# Patient Record
Sex: Female | Born: 1990 | Race: Black or African American | Hispanic: No | Marital: Married | Smoking: Never smoker
Health system: Southern US, Community
[De-identification: ages and names within clinical notes are randomized; demographics above are authoritative.]

## PROBLEM LIST (undated history)

## (undated) DIAGNOSIS — E559 Vitamin D deficiency, unspecified: Secondary | ICD-10-CM

## (undated) DIAGNOSIS — L309 Dermatitis, unspecified: Secondary | ICD-10-CM

## (undated) DIAGNOSIS — F419 Anxiety disorder, unspecified: Secondary | ICD-10-CM

## (undated) DIAGNOSIS — Z91018 Allergy to other foods: Secondary | ICD-10-CM

## (undated) DIAGNOSIS — T7840XA Allergy, unspecified, initial encounter: Secondary | ICD-10-CM

## (undated) DIAGNOSIS — F32A Depression, unspecified: Secondary | ICD-10-CM

## (undated) HISTORY — DX: Dermatitis, unspecified: L30.9

## (undated) HISTORY — DX: Anxiety disorder, unspecified: F41.9

## (undated) HISTORY — DX: Allergy to other foods: Z91.018

## (undated) HISTORY — DX: Depression, unspecified: F32.A

## (undated) HISTORY — DX: Allergy, unspecified, initial encounter: T78.40XA

## (undated) HISTORY — PX: TONSILLECTOMY AND ADENOIDECTOMY: SUR1326

## (undated) HISTORY — DX: Vitamin D deficiency, unspecified: E55.9

---

## 2018-07-01 ENCOUNTER — Other Ambulatory Visit: Payer: Self-pay | Admitting: Ophthalmology

## 2018-07-01 DIAGNOSIS — H4710 Unspecified papilledema: Secondary | ICD-10-CM

## 2018-07-14 ENCOUNTER — Ambulatory Visit
Admission: RE | Admit: 2018-07-14 | Discharge: 2018-07-14 | Disposition: A | Discharge status: Home / Self Care | Payer: 59 | Source: Ambulatory Visit | Attending: Ophthalmology | Admitting: Ophthalmology

## 2018-07-14 DIAGNOSIS — H4710 Unspecified papilledema: Secondary | ICD-10-CM

## 2018-07-14 MED ORDER — GADOBENATE DIMEGLUMINE 529 MG/ML IV SOLN: 19.0000 mL | Freq: Once | INTRAVENOUS | Status: DC | PRN

## 2018-07-14 MED ORDER — GADOBENATE DIMEGLUMINE 529 MG/ML IV SOLN
19.0000 mL | Freq: Once | INTRAVENOUS | Status: AC | PRN
  Administered 2018-07-14: 19 mL via INTRAVENOUS

## 2018-07-21 ENCOUNTER — Ambulatory Visit (INDEPENDENT_AMBULATORY_CARE_PROVIDER_SITE_OTHER): Payer: 59 | Admitting: Neurology

## 2018-07-21 ENCOUNTER — Encounter: Payer: Self-pay | Admitting: Neurology

## 2018-07-21 VITALS — BP 111/68 | HR 80 | Ht 64.75 in | Wt 211.0 lb

## 2018-07-21 DIAGNOSIS — E669 Obesity, unspecified: Secondary | ICD-10-CM

## 2018-07-21 DIAGNOSIS — H4711 Papilledema associated with increased intracranial pressure: Secondary | ICD-10-CM

## 2018-07-21 DIAGNOSIS — G932 Benign intracranial hypertension: Secondary | ICD-10-CM | POA: Diagnosis not present

## 2018-07-21 NOTE — Patient Instructions (Signed)
You may have a condition called pseudotumor cerebri, which means that there increased fluid pressure around your brain and results in pressure on your eye nerve(s). 1. You have had appropriate scans.  2. You will need ongoing formal eye exams, please follow up with Dr. Sherryll Burger for recheck and full visual field test.  3. We will request a LP with pressure testing and routine fluid testing. We will call you with the results.  4. We may start a medication called diamox to help keep your spinal fluid pressure at bay.  5. As you know, your vision and visual field can be affected. The most serious complication of having pseudotumor cerebri is loss of vision which can be permanent. 6. You may need to be considered for a shunt in the future (to prevent fluid pressure from building up). 7. We will do a sleep study to rule out obstructive sleep apnea.

## 2018-07-21 NOTE — Progress Notes (Signed)
Subjective:    Patient ID: Gina Frost is a 28 y.o. female.  HPI     Huston FoleySaima Nichola Warren, MD, PhD [facility] [address], [address] [address] [place], [place] [id number]  Dear Dr. Sherryll BurgerShah,   I saw your patient, Gina Frost, upon your kind request, in my neurologic clinic today for initial consultation of her papilledema, concern for pseudotumor cerebri. The patient is unaccompanied today. As you know, [name] is a 28 year old right-handed woman with an underlying medical history of eczema and obesity, who reports that she was found to have optic nerve swelling on routine eye examination. She went for her routine eye examination with her optometrist and was found to have papilledema. She was then referred to you. I reviewed your office note from 06/26/18, which you kindly included. She had recent imaging studies including brain MRI with and without contrast, MRV head without contrast and orbital MRI with and without contrast on 07/14/2018. I reviewed the test results:  IMPRESSION: MRI brain is within normal limits. No acute or focal intracranial abnormality. No abnormal postcontrast enhancement. No evidence of empty sella or tonsillar herniation. IMPRESSION: Findings consistent with BILATERAL papilledema. No orbital mass or inflammatory process is evident. 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 Transverse sinus stenosis is the most useful and sensitive imaging indicator of idiopathic intracranial hypertension. American Journal of Neuroradiology March 2017, 38 (3) 471-477;  She reports no recurrent headaches, she is not known to snore. She lives with her fianc. Her Epworth sleepiness score is 1 out of 24, fatigue score is 26 out of 63. She is a nonsmoker and drinks alcohol infrequently, caffeine also not daily, maybe once a month in the form of tea.  She is motivated to lose weight. She is not pregnant and has no children.   Her Past Medical History Is Significant For: Past Medical History:  Diagnosis Date  . Eczema     Her Past Surgical History Is Significant For: TE/AE.  Her Family History Is Significant For: Family History  Problem Relation Age of Onset  . Stroke Mother   . Diabetes Maternal Grandmother     Her Social History Is Significant For: Social History   Socioeconomic History  . Marital status: Single    Spouse name: Not on file  . Number of children: Not on file  . Years of education: Not on file  . Highest education level: Not on file  Occupational History  . Not on file  Social Needs  . Financial resource strain: Not on file  . Food insecurity:    Worry: Not on file    Inability: Not on file  . Transportation needs:    Medical: Not on file    Non-medical: Not on file  Tobacco Use  . Smoking status: Never Smoker  . Smokeless tobacco: Never Used  Substance and Sexual Activity  . Alcohol use: Yes    Alcohol/week: 2.0 standard drinks    Types: 2 Standard drinks or equivalent per week  . Drug use: Not on file  . Sexual activity: Not on file  Lifestyle  . Physical activity:    Days per week: Not on file    Minutes per session: Not on file  . Stress: Not on file  Relationships  . Social connections:    Talks on phone: Not on file    Gets together: Not on file  Attends religious service: Not on file    Active member of club or organization: Not on file    Attends meetings of clubs or organizations: Not on file    Relationship status: Not on file  Other Topics Concern  . Not on file  Social History Narrative  . Not on file    Her Allergies Are:  No Known Allergies:   Her Current Medications Are:  Outpatient Encounter Medications as of 07/21/2018  Medication Sig  . betamethasone dipropionate (DIPROLENE) 0.05 % ointment Apply topically.  Lennox Solders (EUCRISA) 2 % OINT Apply topically.    No facility-administered encounter medications on file as of 07/21/2018.   : Review of Systems:  Out of a complete 14 point review of systems, all are reviewed and negative with the exception of these symptoms as listed below:  Review of Systems  Neurological:       Pt presents today to discuss the swelling her eyes that was found during her eye exam. Pt denies any symptoms, including headaches.  Pt has never had a sleep study and unsure if she snores.  Epworth Sleepiness Scale 0= would never doze 1= slight chance of dozing 2= moderate chance of dozing 3= high chance of dozing  Sitting and reading: 0 Watching TV: 0 Sitting inactive in a public place (ex. Theater or meeting): 0 As a passenger in a car for an hour without a break: 0 Lying down to rest in the afternoon: 1 Sitting and talking to someone: 0 Sitting quietly after lunch (no alcohol): 0 In a car, while stopped in traffic: 0 Total: 1     Objective:  Neurological Exam  Physical Exam Physical Examination:   Vitals:   07/21/18 1440  BP: 111/68  Pulse: 80   General Examination: The patient is a very pleasant 28 y.o. female in no acute distress. She appears well-developed and well-nourished and well groomed.   HEENT: Normocephalic, atraumatic, pupils are equal, round and reactive to light and accommodation. Funduscopic exam shows fuzzy disc margins b/l. Visual fields are full by finger perimetry. She has corrective eyeglasses.  Extraocular tracking is good without limitation to gaze excursion or nystagmus noted. Normal smooth pursuit is noted. Hearing is grossly intact. Face is symmetric with normal facial animation and normal facial sensation. Speech is clear with no dysarthria noted. There is no hypophonia. There is no lip, neck/head, jaw or voice tremor. Neck is supple with full range of passive and active motion. There are no carotid bruits on auscultation. Oropharynx exam reveals: mild mouth dryness, good dental  hygiene and no significant airway crowding, tonsils are absent. Mallampati is class I. Tongue protrudes centrally and palate elevates symmetrically. Neck size is 14.25 inches.   Chest: Clear to auscultation without wheezing, rhonchi or crackles noted.  Heart: S1+S2+0, regular and normal without murmurs, rubs or gallops noted.   Abdomen: Soft, non-tender and non-distended with normal bowel sounds appreciated on auscultation.  Extremities: There is no obvious abn.   Skin: Warm and dry without trophic changes noted.  Musculoskeletal: exam reveals no obvious joint deformities, tenderness or joint swelling or erythema.   Neurologically:  Mental status: The patient is awake, alert and oriented in all 4 spheres. Her immediate and remote memory, attention, language skills and fund of knowledge are appropriate. There is no evidence of aphasia, agnosia, apraxia or anomia. Speech is clear with normal prosody and enunciation. Thought process is linear. Mood is normal and affect is normal.  Cranial nerves II - XII  are as described above under HEENT exam. In addition: shoulder shrug is normal with equal shoulder height noted. Motor exam: Normal bulk, strength and tone is noted. There is no drift, tremor or rebound. Romberg is negative. Reflexes are 2+ throughout. Fine motor skills and coordination: intact with normal finger taps, normal hand movements, normal rapid alternating patting, normal foot taps and normal foot agility.  Cerebellar testing: No dysmetria or intention tremor on finger to nose testing. Heel to shin is unremarkable bilaterally. There is no truncal or gait ataxia.  Sensory exam: intact to light touch in the upper and lower extremities.  Gait, station and balance: She stands easily. No veering to one side is noted. No leaning to one side is noted. Posture is age-appropriate and stance is narrow based. Gait shows normal stride length and normal pace. No problems turning are noted. Tandem walk  is unremarkable.   Assessment and Plan:   In summary, Vernicia Rockwood is a very pleasant 28 y.o.-year old female with an underlying benign medical history except for eczema and obesity, who was found to have bilateral papilledema on routine eye examination recently. She has no neurological symptoms such as recurrent headaches, examination is otherwise benign. Brain scans were done in the form of brain MRI with and without contrast, orbital MRI with and without contrast and MRV. She has stenosis bilaterally of his transfer sinuses. We will proceed with a lumbar puncture through [facility] imaging. I talked to the patient at length about idiopathic intracranial hypertension or pseudotumor cerebri. We will also proceed with a sleep study to rule out obstructive sleep apnea as a correlate. We talked about potentially utilizing medication to keep her fluid pressure at bay. I may request consultation with interventional radiology as well. She is advised that recurrent full eye examination visual field testing is going to be key. She is advised that loss of vision would be the most serious complication of her condition and that it can be permanent. I will see her back routinely in 3 months and we will keep her posted as to her lumbar puncture results and sleep study results in the interim and we may start Diamox in the interim as well after h he needs to on the newser lumbar puncture. She is advised to work Is a little high I answered all her questions today and the patient was in agreement with the above outlined plan.  Thank you very much for allowing me to participate in the care of this nice patient. If I can be of any further assistance to you please do not hesitate to call me at [phone].  Sincerely,   Huston Foley, MD, PhD

## 2018-07-31 ENCOUNTER — Telehealth: Payer: Self-pay

## 2018-07-31 NOTE — Telephone Encounter (Signed)
Received a call from [place] at [facility]. They want to clarify exactly what bacterial and viral cultures are needed from the CSF testing, such as herpes, etc. A gram stain is included in the bacterial cultures.  Enrique Sack can be reached at [phone].

## 2018-07-31 NOTE — Telephone Encounter (Signed)
I called Enrique Sack back to discuss. No answer, left a message asking her to call me back.

## 2018-07-31 NOTE — Telephone Encounter (Signed)
No herpes testing on CSF or specific testing needed. Just routine cultures, if possible, no specific bacteria either.

## 2018-07-31 NOTE — Telephone Encounter (Signed)
I called Gina Frost and advised her that Dr. Frances Furbish is requesting routine cultures. Gina Frost verbalized understanding.

## 2018-09-05 ENCOUNTER — Ambulatory Visit
Admission: RE | Admit: 2018-09-05 | Discharge: 2018-09-05 | Disposition: A | Discharge status: Home / Self Care | Payer: 59 | Source: Ambulatory Visit | Attending: Neurology | Admitting: Neurology

## 2018-09-05 VITALS — BP 121/81 | HR 78

## 2018-09-05 DIAGNOSIS — H4711 Papilledema associated with increased intracranial pressure: Secondary | ICD-10-CM

## 2018-09-05 DIAGNOSIS — G932 Benign intracranial hypertension: Secondary | ICD-10-CM

## 2018-09-05 DIAGNOSIS — E669 Obesity, unspecified: Secondary | ICD-10-CM

## 2018-09-05 NOTE — Discharge Instructions (Signed)

## 2018-09-08 ENCOUNTER — Other Ambulatory Visit: Payer: Self-pay | Admitting: Neurology

## 2018-09-08 ENCOUNTER — Telehealth: Payer: Self-pay | Admitting: Neurology

## 2018-09-08 MED ORDER — ACETAZOLAMIDE ER 500 MG PO CP12: ORAL_CAPSULE | ORAL | 5 refills | Status: DC

## 2018-09-08 NOTE — Telephone Encounter (Signed)
-----   Message from Huston Foley, MD sent at 09/08/2018  9:03 AM EST ----- Please advise patient that her recent spinal fluid examination with lumbar puncture showed an increased spinal fluid pressure which we suspected. Routine labs on the spinal fluid specimens were sent and so far are fine, which we expected.  Her findings are in keeping with a diagnosis of pseudotumor cerebri, which we discussed recently during our office visit, and goes along with her bilateral eye nerve swelling as detected by her eye doctor. As discussed, she will need ongoing eye examinations on a regular basis. I would like for her to start Diamox (generic name: acetazolamide) 500 mg strength: Take one pill each night at bedtime for 1 week, then 1 pill twice daily thereafter. Common side effects reported are: Dizziness, lightheadedness, increase in urine output, blurry vision, dry mouth, drowsiness, loss of appetite, upset stomach, headache and tiredness, tingling in the hands and feet and change in taste, especially with carbonated sodas. Most side effects are transient especially during the first few days as the body adjusts to the medication.  Rx sent to pharm.   Follow-up as scheduled in April. I also ordered a sleep study. Please inquire with the sleep lab as to the status of her sleep study.

## 2018-09-08 NOTE — Telephone Encounter (Signed)
Patient is in the lobby wanting to speak with the nurse or doctor regarding migraine. Best call back [phone]

## 2018-09-08 NOTE — Progress Notes (Signed)
Please advise patient that her recent spinal fluid examination with lumbar puncture showed an increased spinal fluid pressure which we suspected. Routine labs on the spinal fluid specimens were sent and so far are fine, which we expected.  Her findings are in keeping with a diagnosis of pseudotumor cerebri, which we discussed recently during our office visit, and goes along with her bilateral eye nerve swelling as detected by her eye doctor. As discussed, she will need ongoing eye examinations on a regular basis. I would like for her to start Diamox (generic name: acetazolamide) 500 mg strength: Take one pill each night at bedtime for 1 week, then 1 pill twice daily thereafter. Common side effects reported are: Dizziness, lightheadedness, increase in urine output, blurry vision, dry mouth, drowsiness, loss of appetite, upset stomach, headache and tiredness, tingling in the hands and feet and change in taste, especially with carbonated sodas. Most side effects are transient especially during the first few days as the body adjusts to the medication.  Rx sent to pharm.   Follow-up as scheduled in April. I also ordered a sleep study. Please inquire with the sleep lab as to the status of her sleep study.

## 2018-09-08 NOTE — Telephone Encounter (Addendum)
I spoke with Dr. Frances Furbish. She does not recommend a blood patch. She recommends hydration, rest, tylenol PRN, and caffeine to help with pt's headache.  I discussed this with the pt. Pt is agreeable to this plan but asks what to do if her headache doesn't resolve. I recommended that if pt tries Dr. Teofilo Pod recommendations and this does not relieve her headache pt should ask someone to take her to the ER for consideration of  IV meds. I reminded pt that she should not drive while she has a headache and after IV medication treatment of her headache. Pt reports that she wants to know if there is a "holistic" treatment instead of diamox. Pt does not want to take diamox for the rest of her life and she reports that her insurance won't cover medications and she can't afford any medications. I recommended that pt lose weight but pt also wanted me to discuss this with Dr. Frances Furbish.  I spoke with Dr. Frances Furbish again. Dr. Frances Furbish recommends that pt discuss a holistic approach for treatment of her pseudotumor cerebri with another neurologist as a second opinion and this referral can be obtained by her PCP.  I advised pt of this and also discussed with her the risk of damage to her eyes if her pseudotumor cerebri is not treated. Pt verbalized understanding and left the office.

## 2018-09-08 NOTE — Telephone Encounter (Signed)
I called pt. She reports that she is feeling better after rest and hydration. I discussed all of Dr. Guadelupe Sabin recommendations with pt again. Pt reports that she sees Dr. Manuella Ghazi, her eye doctor, every month and has an appointment with him next week. I reiterated the importance of not leaving her PTC untreated due to the risk of losing her vision which may be permanent. Pt verbalized understanding of recommendations. Pt had no questions at this time but was encouraged to call back if questions arise.

## 2018-09-08 NOTE — Telephone Encounter (Signed)
I not sure how to help this patient, if she is not agreeable to follow my recommendations. I am not aware of any "holistic" treatment options for PTC. She is advised to discuss referral to another practice for ongoing management, if she cannot follow my recommendations. I would not recommend she leave the pseudotumor cerebri untreated and she would be at risk of losing vision which can be permanent. She is furthermore advised to make a follow-up appointment with her eye doctor. She is strongly advised to discuss her options with her primary care physician.

## 2018-09-08 NOTE — Telephone Encounter (Addendum)
Pt presented to the lobby requesting to speak with an RN or MD regarding her headache. Pt reports that since her LP on Friday she has had a severe headache that does feel better once she lays down. Pt went to work this morning but was feeling nauseous with her headache so decided that she can't work today. Pt reports that she went to [facility] and they told her to go to her neurologist's office. Pt is wondering if we can do anything for her headache.  I discussed with pt her LP results and Dr. Teofilo Pod recommendations from here. Pt is agreeable to starting diamox and understands the potential side effects.   Pt declined a sleep study but she cannot afford it right now.  Pt will wait for further instructions regarding her headache from Dr. Frances Furbish.

## 2018-09-23 ENCOUNTER — Telehealth: Payer: Self-pay | Admitting: Neurology

## 2018-09-23 MED ORDER — ACETAZOLAMIDE ER 500 MG PO CP12: ORAL_CAPSULE | ORAL | 5 refills | Status: DC

## 2018-09-23 NOTE — Telephone Encounter (Signed)
Pt returned my call. She is using [facility] and they told her that the diamox was going to cost her $90 per month. I advised her that when I checked [email or web address], her diamox RX had a coupon at [facility] for $22.95. She has not started diamox yet. Her ophthalmologist gave her a goodrx card. She asked that we transfer her diamox RX to [facility] on [address]. Pt is agreeable to starting diamox right away and I reminded her of the importance of this and of her new appt in April. Pt verbalized understanding.

## 2018-09-23 NOTE — Telephone Encounter (Signed)
I called pt to discuss. No answer, left a message asking her to call me back. 

## 2018-09-23 NOTE — Telephone Encounter (Signed)
Called patient 3/10 to r/s her appt with Dr. Frances Furbish the morning of 4/6. Patient stated while on the phone that her medication she is being prescribed is too expensive. She is wondering if there is a generic version that can be prescribed to her. I told her that I would relay this info to Dr. Frances Furbish and her nurse and they will reach out.

## 2018-09-23 NOTE — Addendum Note (Signed)
Addended by: Geronimo Running A on: 09/23/2018 11:18 AM   Modules accepted: Orders

## 2018-10-03 LAB — FUNGUS CULTURE W SMEAR
MICRO NUMBER: 226584
SMEAR:: NONE SEEN
SPECIMEN QUALITY:: ADEQUATE

## 2018-10-03 LAB — CSF CULTURE

## 2018-10-03 LAB — CSF CELL COUNT WITH DIFFERENTIAL
RBC Count, CSF: 0 cells/uL (ref 0–10)
WBC, CSF: 1 cells/uL (ref 0–5)

## 2018-10-03 LAB — CSF CULTURE W GRAM STAIN
MICRO NUMBER:: 226585
Result:: NO GROWTH
SPECIMEN QUALITY:: ADEQUATE

## 2018-10-03 LAB — GLUCOSE, CSF: Glucose, CSF: 58 mg/dL (ref 40–80)

## 2018-10-03 LAB — PROTEIN, CSF: Total Protein, CSF: 12 mg/dL — ABNORMAL LOW (ref 15–45)

## 2018-10-15 ENCOUNTER — Telehealth: Payer: Self-pay

## 2018-10-15 NOTE — Telephone Encounter (Signed)
Due to current COVID 19 pandemic, our office is severely reducing in office visits for at least the next 2 weeks, in order to minimize the risk to our patients and healthcare providers.   I called pt to discuss changing her appt to a virtual visit. No answer, left a message asking her to call me back.

## 2018-10-15 NOTE — Telephone Encounter (Signed)
Pt called back and consented to a Televisit and for the insurance to be billed as such.

## 2018-10-15 NOTE — Telephone Encounter (Signed)
I called pt. She is agreeable to a virtual visit at her appt time on 10/21/18.  Pt understands that although there may be some limitations with this type of visit, we will take all precautions to reduce any security or privacy concerns.  Pt understands that this will be treated like an in office visit and we will file with pt's insurance, and there may be a patient responsible charge related to this service.  Pt's email is [email or web address]. Pt understands that the [employer] webex software must be downloaded and operational on the device pt plans to use for the visit.  Pt reports that she is taking diamox BID.  Pt's meds, allergies, and PMH were updated.

## 2018-10-20 ENCOUNTER — Ambulatory Visit: Payer: 59 | Admitting: Neurology

## 2018-10-21 ENCOUNTER — Ambulatory Visit (INDEPENDENT_AMBULATORY_CARE_PROVIDER_SITE_OTHER): Payer: 59 | Admitting: Neurology

## 2018-10-21 ENCOUNTER — Encounter: Payer: Self-pay | Admitting: Neurology

## 2018-10-21 ENCOUNTER — Other Ambulatory Visit: Payer: Self-pay

## 2018-10-21 DIAGNOSIS — H4711 Papilledema associated with increased intracranial pressure: Secondary | ICD-10-CM

## 2018-10-21 DIAGNOSIS — G932 Benign intracranial hypertension: Secondary | ICD-10-CM | POA: Diagnosis not present

## 2018-10-21 DIAGNOSIS — E669 Obesity, unspecified: Secondary | ICD-10-CM

## 2018-10-21 NOTE — Progress Notes (Signed)
Interim history:  Gina Frost is a 28 year old right-handed woman with an underlying medical history of eczema and obesity, with whom I am conducting a virtual, video based follow-up appointment via Webex, new over face-to-face visit for follow-up of her pseudotumor cerebri. The patient is unaccompanied today and joins via home computer. I first met her on 07/21/2018 at the request of her ophthalmologist, Dr. Manuella Ghazi, at which time she reported that a routine eye examination with her optometrist had shown optic nerve swelling and she was referred to ophthalmology. Dr. Manuella Ghazi had ordered brain and orbital MRI as well as MRV. 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 No orbital mass. I suggested we proceed with lumbar puncture testing and a sleep study. She did not proceed with a sleep study due to cost. She had a lumbar puncture at [facility] on 09/05/2018 and I reviewed the results: IMPRESSION: Technically successful diagnostic lumbar puncture, with elevated opening pressure of 36 cm water. Fluid clear and colorless. Closing pressure was reduced approximately 50%. See discussion above.  We called her with her test results and she was advised to start Diamox. She was very reluctant to start the medication and asked for alternative treatment options including holistic management of pseudotumor. She was advised that I was not aware of any other nonpharmacological treatment options other than medications such as Diamox. She was encouraged to talk to her primary care physician about a referral to another specialist if she felt better about seeing another specialist perhaps, and she was encouraged to follow-up with ophthalmology. She was encouraged to start the Diamox. She called in March reporting that the Diamox was too expensive. She was given advice regarding an alternative pharmacy to  be able to start the Diamox and she agreed.   I also reviewed an interim office note from her ophthalmologist, Dr. Manuella Ghazi from 09/11/2018. Fundus photos demonstrate the presence of disc edema both eyes. She was strongly recommended to start Diamox as prescribed by neurology. She was also advised that a natural approach unlikely would address swelling quickly enough and could be risking permanent visual loss. She was advised to consider generic Topamax as a cheaper alternative to Diamox. She was advised to follow-up in one month.  Today, 10/21/2018: Please also see below for documentation on the virtual visit.  Previously:  07/21/2018: (She) reports that she was found to have optic nerve swelling on routine eye examination. She went for her routine eye examination with her optometrist and was found to have papilledema. She was then referred to you. I reviewed your office note from 06/26/18, which you kindly included. She had recent imaging studies including brain MRI with and without contrast, MRV head without contrast and orbital MRI with and without contrast on 07/14/2018. I reviewed the test results:  IMPRESSION: MRI brain is within normal limits. No acute or focal intracranial abnormality. No abnormal postcontrast enhancement. No evidence of empty sella or tonsillar herniation. IMPRESSION: Findings consistent with BILATERAL papilledema. No orbital mass or inflammatory process is evident. 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 Transverse sinus stenosis is the most useful and sensitive imaging indicator of idiopathic intracranial hypertension. American Journal of Neuroradiology March 2017, 38 (3) 471-477;   She reports no recurrent headaches, she is not known to snore. She lives with her fianc. Her Epworth  sleepiness score is 1 out of 24, fatigue score is 26 out of 63.  She is a nonsmoker and drinks alcohol infrequently, caffeine also not daily, maybe once a month in the form of tea. She is motivated to lose weight. She is not pregnant and has no children.     Her Past Medical History Is Significant For: Past Medical History:  Diagnosis Date   Eczema     Her Past Surgical History Is Significant For:  Her Family History Is Significant For: Family History  Problem Relation Age of Onset   Stroke Mother    Diabetes Maternal Grandmother     Her Social History Is Significant For: Social History   Socioeconomic History   Marital status: Single    Spouse name: Not on file   Number of children: Not on file   Years of education: Not on file   Highest education level: Not on file  Occupational History   Not on file  Social Needs   Financial resource strain: Not on file   Food insecurity:    Worry: Not on file    Inability: Not on file   Transportation needs:    Medical: Not on file    Non-medical: Not on file  Tobacco Use   Smoking status: Never Smoker   Smokeless tobacco: Never Used  Substance and Sexual Activity   Alcohol use: Yes    Alcohol/week: 2.0 standard drinks    Types: 2 Standard drinks or equivalent per week   Drug use: Not on file   Sexual activity: Not on file  Lifestyle   Physical activity:    Days per week: Not on file    Minutes per session: Not on file   Stress: Not on file  Relationships   Social connections:    Talks on phone: Not on file    Gets together: Not on file    Attends religious service: Not on file    Active member of club or organization: Not on file    Attends meetings of clubs or organizations: Not on file    Relationship status: Not on file  Other Topics Concern   Not on file  Social History Narrative   Not on file    Her Allergies Are:  No Known Allergies:   Her Current Medications Are:  Outpatient Encounter Medications as of 10/21/2018  Medication Sig   acetaZOLAMIDE  (DIAMOX SEQUELS) 500 MG capsule 1 pill nightly at bedtime for 1 week, then 1 pill twice daily thereafter.   betamethasone dipropionate (DIPROLENE) 0.05 % ointment Apply topically.   Crisaborole (EUCRISA) 2 % OINT Apply topically.   No facility-administered encounter medications on file as of 10/21/2018.   :  Review of Systems:  Out of a complete 14 point review of systems, all are reviewed and negative with the exception of these symptoms as listed below:  Virtual Visit via Video Note on 10/21/2018:  I connected with@ on 10/21/18 at  9:30 AM EDT by a video enabled telemedicine application and verified that I am speaking with the correct person using two identifiers.   I discussed the limitations of evaluation and management by telemedicine and the availability of in person appointments. The patient expressed understanding and agreed to proceed.  History of Present Illness:  She reports being on Diamox 500 mg twice a day. She filled the prescription on 09/23/2018 from [facility]. She reports being compliant with it and has the bottle with her. She has noticed some  side effects including appetite loss and some nausea, tingling in her hands and fingers and sometimes in her feet. All of these are tolerable to her to the point that she is willing to continue with the medication. She has noticed no new symptoms and no visual symptoms and in fact reports that she did not have any visual symptoms to begin with. She is working on weight loss. She has started walking and her second job does involve quite a bit of walking. She works at [employer] She works for [employer], currently works from home for this. She had to push out her follow-up appointment with ophthalmology and is currently scheduled for her next appointment on 11/20/2018. She is otherwise without concerns, has not noticed any double vision, blurry vision, recurrent headaches.  Observations/Objective:  Her most  recent vital signs are from 09/05/2018, blood pressure 121/81 with a pulse of 78. She is pleasant, conversant, no acute distress. Face is symmetric, speech is clear without dysarthria or voice tremor. Airway examination reveals normal tongue protrusion and palate elevates symmetrically. Face within normal animation. Extraocular movements are intact, no nystagmus noted with extraocular movements. She denies diplopia. Romberg is negative. Walking is unremarkable, fine motor skills with finger taps and hand movements are unremarkable. She has no drift. Finger to nose is unremarkable bilaterally.  Assessment and Plan: In summary, Lurene Robley is a very pleasant 28 y.o. female with an underlying benign medical history except for eczema and obesity, who was found to have bilateral papilledema on routine eye examination recently. A subsequent lumbar puncture in February 2020 indicated an elevated opening pressure at 36. She was hesitant to start Diamox in the beginning and had a repeat eye examination in late February 2020 with Dr. Manuella Ghazi with confirmed papilledema noted. She has since then started Diamox on 09/23/2018 and is compliant with treatment per her report, her bottle also shows usage of the medication as she held it up against the camera. She reports some side effects but indicates that they are indeed tolerable and she is motivated to continue with the medication. She is commended for her treatment adherence and again advised that this condition called pseudotumor cerebri is a real medical condition confirmed by eye examination and lumbar puncture recently. Thankfully, she does not have any telltale symptoms such as blurry vision, loss of vision in the periphery or recurrent headaches which is obviously reassuring. Nevertheless, ongoing medical management and sequential I examination are important and she is encouraged to keep her follow-up appointment with ophthalmology. She did not need a refill on her  Diamox quite yet. We will convert to a 90 day prescription next time. Her examination albeit limited today is nonfocal. She had previously undergone brain MRI, orbital MRI and MRV testing through her ophthalmologist. She did not pursue a sleep study due to cost. She is working on weight loss for which she is commended. I recommended a follow-up in 3 months in the office. She is encouraged to call with any interim questions or concerns. I answered all her questions today and she was in agreement.   Follow Up Instructions:  1. Continue Diamox 500 mg twice a day, consider switching prescription next time to 90 day with refills. 2. Keep follow-up appointment with ophthalmologist on 11/20/2018. 3. Follow-up in 3 months.    I discussed the assessment and treatment plan with the patient. The patient was provided an opportunity to ask questions and all were answered. The patient agreed with the plan and  demonstrated an understanding of the instructions.   The patient was advised to call back or seek an in-person evaluation if the symptoms worsen or if the condition fails to improve as anticipated.  I provided 25 minutes of non-face-to-face time during this encounter.   Star Age, MD

## 2018-10-21 NOTE — Patient Instructions (Signed)
Given verbally, during today's virtual video-based encounter, with verbal feedback received.   

## 2019-01-20 ENCOUNTER — Ambulatory Visit: Payer: 59 | Admitting: Neurology

## 2019-01-28 ENCOUNTER — Ambulatory Visit (INDEPENDENT_AMBULATORY_CARE_PROVIDER_SITE_OTHER): Payer: 59 | Admitting: Neurology

## 2019-01-28 ENCOUNTER — Other Ambulatory Visit: Payer: Self-pay

## 2019-01-28 ENCOUNTER — Telehealth: Payer: Self-pay | Admitting: Neurology

## 2019-01-28 ENCOUNTER — Encounter: Payer: Self-pay | Admitting: Neurology

## 2019-01-28 VITALS — BP 103/66 | HR 66 | Ht 65.0 in | Wt 200.0 lb

## 2019-01-28 DIAGNOSIS — E669 Obesity, unspecified: Secondary | ICD-10-CM | POA: Diagnosis not present

## 2019-01-28 DIAGNOSIS — G932 Benign intracranial hypertension: Secondary | ICD-10-CM | POA: Diagnosis not present

## 2019-01-28 MED ORDER — ACETAZOLAMIDE ER 500 MG PO CP12: 500.0000 mg | ORAL_CAPSULE | Freq: Two times a day (BID) | ORAL | 3 refills | Status: DC

## 2019-01-28 NOTE — Patient Instructions (Signed)
I am glad you are doing well.  Please continue with the Diamox 500 mg twice daily, I will change the prescription to 90 days with refills.  Please keep your follow-up appointment with your ophthalmologist next month and keep [place] posted.  Please follow-up in 6 months with the nurse practitioner.

## 2019-01-28 NOTE — Telephone Encounter (Signed)
Geni Bers from [facility] on [address] called to inform us that the pt came in with a Good RX card to get her acetaZOLAMIDE (DIAMOX SEQUELS) 500 MG capsule for $22.95 for 60 capsules. Geni Bers states that price is if she were to get it at a [facility]. The prices need to be matched up to the right pharmacy so the [facility] price for the [facility] is $62.22 for 60 capsules. Please advise.

## 2019-01-28 NOTE — Progress Notes (Signed)
Subjective:    Patient ID: Gina Frost is a 28 y.o. female.  HPI     Interim history:   Gina Frost is a 28 year old right-handed woman with an underlying medical history of eczema and obesity, who presents for follow-up consultation of her pseudotumor cerebri.  She is unaccompanied today.  I last saw her in virtual visit on 10/21/2018, at which time she was on Diamox and doing fairly well, had some tingling and noted loss of appetite and some nausea.  She had seen her ophthalmologist on 09/11/2018 and he had encouraged her to start Diamox as well.  Today, 01/28/2019: She reports doing well, continues to take the Diamox 500 mg twice daily, tolerates it, still has the occasional tingling in the fingertips and her toes.  She still has some loss of appetite but weight is stable she reports.  She does not drink any sodas, tries to drink water and juice.  She has an appointment with her ophthalmologist pending for 02/26/2019.  She has noticed no changes in her vision.  The patient's allergies, current medications, family history, past medical history, past social history, past surgical history and problem list were reviewed and updated as appropriate.    Previously:  I first met her on 07/21/2018 at the request of her ophthalmologist, Dr. Manuella Ghazi, at which time she reported that a routine eye examination with her optometrist had shown optic nerve swelling and she was referred to ophthalmology. Dr. Manuella Ghazi had ordered brain and orbital MRI as well as MRV. 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 No orbital mass. I suggested we proceed with lumbar puncture testing and a sleep study. She did not proceed with a sleep study due to cost. She had a lumbar puncture at [facility] on 09/05/2018 and I reviewed the results: IMPRESSION: Technically successful diagnostic lumbar puncture, with  elevated opening pressure of 36 cm water. Fluid clear and colorless. Closing pressure was reduced approximately 50%. See discussion above.   We called her with her test results and she was advised to start Diamox. She was very reluctant to start the medication and asked for alternative treatment options including holistic management of pseudotumor. She was advised that I was not aware of any other nonpharmacological treatment options other than medications such as Diamox. She was encouraged to talk to her primary care physician about a referral to another specialist if she felt better about seeing another specialist perhaps, and she was encouraged to follow-up with ophthalmology. She was encouraged to start the Diamox. She called in March reporting that the Diamox was too expensive. She was given advice regarding an alternative pharmacy to be able to start the Diamox and she agreed.    I also reviewed an interim office note from her ophthalmologist, Dr. Manuella Ghazi from 09/11/2018. Fundus photos demonstrate the presence of disc edema both eyes. She was strongly recommended to start Diamox as prescribed by neurology. She was also advised that a natural approach unlikely would address swelling quickly enough and could be risking permanent visual loss. She was advised to consider generic Topamax as a cheaper alternative to Diamox. She was advised to follow-up in one month.     07/21/2018: (She) reports that she was found to have optic nerve swelling on routine eye examination. She went for her routine eye examination with her optometrist and was found to have papilledema. She was then referred to you.  I reviewed your office note from 06/26/18, which you kindly included. She had recent imaging studies including brain MRI with and without contrast, MRV head without contrast and orbital MRI with and without contrast on 07/14/2018. I reviewed the test results:  IMPRESSION: MRI brain is within normal limits. No acute or  focal intracranial abnormality. No abnormal postcontrast enhancement. No evidence of empty sella or tonsillar herniation. IMPRESSION: Findings consistent with BILATERAL papilledema. No orbital mass or inflammatory process is evident. 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 Transverse sinus stenosis is the most useful and sensitive imaging indicator of idiopathic intracranial hypertension. American Journal of Neuroradiology March 2017, 38 (3) 471-477;   She reports no recurrent headaches, she is not known to snore. She lives with her fianc. Her Epworth sleepiness score is 1 out of 24, fatigue score is 26 out of 63. She is a nonsmoker and drinks alcohol infrequently, caffeine also not daily, maybe once a month in the form of tea. She is motivated to lose weight. She is not pregnant and has no children.    Her Past Medical History Is Significant For: Past Medical History:  Diagnosis Date  . Eczema     Her Past Surgical History Is Significant For: Past Surgical History:  Procedure Laterality Date  . TONSILLECTOMY AND ADENOIDECTOMY      Her Family History Is Significant For: Family History  Problem Relation Age of Onset  . Stroke Mother   . Diabetes Maternal Grandmother     Her Social History Is Significant For: Social History   Socioeconomic History  . Marital status: Single    Spouse name: Not on file  . Number of children: Not on file  . Years of education: Not on file  . Highest education level: Not on file  Occupational History  . Not on file  Social Needs  . Financial resource strain: Not on file  . Food insecurity    Worry: Not on file    Inability: Not on file  . Transportation needs    Medical: Not on file    Non-medical: Not on file  Tobacco Use  . Smoking status: Never Smoker  . Smokeless tobacco: Never Used  Substance and Sexual  Activity  . Alcohol use: Yes    Alcohol/week: 2.0 standard drinks    Types: 2 Standard drinks or equivalent per week  . Drug use: Not on file  . Sexual activity: Not on file  Lifestyle  . Physical activity    Days per week: Not on file    Minutes per session: Not on file  . Stress: Not on file  Relationships  . Social Herbalist on phone: Not on file    Gets together: Not on file    Attends religious service: Not on file    Active member of club or organization: Not on file    Attends meetings of clubs or organizations: Not on file    Relationship status: Not on file  Other Topics Concern  . Not on file  Social History Narrative  . Not on file    Her Allergies Are:  No Known Allergies:   Her Current Medications Are:  Outpatient Encounter Medications as of 01/28/2019  Medication Sig  . acetaZOLAMIDE (DIAMOX SEQUELS) 500 MG capsule 1 pill nightly at bedtime for 1 week, then 1 pill twice daily thereafter.  . betamethasone dipropionate (DIPROLENE) 0.05 %  ointment Apply topically.  Stasia Cavalier (EUCRISA) 2 % OINT Apply topically.   No facility-administered encounter medications on file as of 01/28/2019.   :  Review of Systems:  Out of a complete 14 point review of systems, all are reviewed and negative with the exception of these symptoms as listed below: Review of Systems  Neurological:       Pt presents today to discuss her ICH. Diamox is going well. She sees her eye doctor next month.    Objective:  Neurological Exam  Physical Exam Physical Examination:   Vitals:   01/28/19 0824  BP: 103/66  Pulse: 66    General Examination: The patient is a very pleasant 28 y.o. female in no acute distress. She appears well-developed and well-nourished and well groomed.   HEENT: Normocephalic, atraumatic, pupils are equal, round and reactive to light and accommodation. Funduscopic exam shows Fairly good disc margins, no obvious papilledema noted.  She wears  corrective eyeglasses. Extraocular tracking is good without limitation to gaze excursion or nystagmus noted. Normal smooth pursuit is noted. Hearing is grossly intact. Face is symmetric with normal facial animation and normal facial sensation. Speech is clear with no dysarthria noted. There is no hypophonia. There is no lip, neck/head, jaw or voice tremor. Neck is supple with full range of passive and active motion. There are no carotid bruits on auscultation. Oropharynx exam reveals: mild mouth dryness, good dental hygiene and no significant airway crowding, tonsils are absent. Mallampati is class I. Tongue protrudes centrally and palate elevates symmetrically.   Chest: Clear to auscultation without wheezing, rhonchi or crackles noted.  Heart: S1+S2+0, regular and normal without murmurs, rubs or gallops noted.   Abdomen: Soft, non-tender and non-distended with normal bowel sounds appreciated on auscultation.  Extremities: There is no obvious abn.   Skin: Warm and dry without trophic changes noted.  Musculoskeletal: exam reveals no obvious joint deformities, tenderness or joint swelling or erythema.   Neurologically:  Mental status: The patient is awake, alert and oriented in all 4 spheres. Her immediate and remote memory, attention, language skills and fund of knowledge are appropriate. There is no evidence of aphasia, agnosia, apraxia or anomia. Speech is clear with normal prosody and enunciation. Thought process is linear. Mood is normal and affect is normal.  Cranial nerves II - XII are as described above under HEENT exam.  Motor exam: Normal bulk, strength and tone is noted. There is no drift, tremor or rebound. Romberg is negative. Reflexes are 2+ throughout. Fine motor skills and coordination: intact with normal finger taps, normal hand movements, normal rapid alternating patting, normal foot taps and normal foot agility.  Cerebellar testing: No dysmetria or intention tremor on finger  to nose testing. Heel to shin is unremarkable bilaterally. There is no truncal or gait ataxia.  Sensory exam: intact to light touch in the upper and lower extremities.  Gait, station and balance: She stands easily. No veering to one side is noted. No leaning to one side is noted. Posture is age-appropriate and stance is narrow based. Gait shows normal stride length and normal pace. No problems turning are noted. Tandem walk is unremarkable.   Assessment and Plan:   In summary, Gina Frost is a very pleasant 28 year old female with an underlying benign medical history except for eczema and obesity, who presents for FU consultation of her IIH. She has no neurological symptoms such as recurrent headaches, examination is otherwise benign, No obvious papilledema noted today.  She has an  appointment with her ophthalmologist next month which I asked her to keep.  She had brain MRI with and without contrast as well as orbital MRI with and without contrast and MRV. She has stenosis bilaterally of his transverse sinuses. Lumbar puncture showed opening pressure to be elevated.  She has been on Diamox since approximately March 2020.  She tolerates the medication, has some intermittent tingling, some taste changes, some appetite loss, but overall tolerates it.  She is planning her wedding for next year, she is not currently planning to get pregnant.  She is advised that Diamox may not be considered fully safe during pregnancy and that because of her intracranial hypertension she may be considered high risk pregnancy when she is ready to embark on family-planning.  She is advised to continue with her Diamox and I renewed her prescription for 90 days with refills, she is encouraged to call us with any problems or concerns.  She is encouraged to work on weight loss. She can follow-up routinely in 6 months with a nurse practitioner.  I answered all her questions today and she was in agreement.

## 2019-01-28 NOTE — Telephone Encounter (Signed)
Pt asked me prior to leaving her appt to send her RX for diamox to [facility]. Nothing further needed.

## 2019-05-27 DIAGNOSIS — L680 Hirsutism: Secondary | ICD-10-CM | POA: Insufficient documentation

## 2019-05-27 DIAGNOSIS — L739 Follicular disorder, unspecified: Secondary | ICD-10-CM | POA: Insufficient documentation

## 2019-05-27 DIAGNOSIS — L810 Postinflammatory hyperpigmentation: Secondary | ICD-10-CM | POA: Insufficient documentation

## 2019-08-04 ENCOUNTER — Ambulatory Visit: Payer: Self-pay | Admitting: Adult Health

## 2020-03-02 ENCOUNTER — Other Ambulatory Visit: Payer: Self-pay

## 2020-03-02 DIAGNOSIS — Z20822 Contact with and (suspected) exposure to covid-19: Secondary | ICD-10-CM

## 2020-03-03 LAB — NOVEL CORONAVIRUS, NAA: SARS-CoV-2, NAA: NOT DETECTED

## 2020-03-03 LAB — SARS-COV-2, NAA 2 DAY TAT

## 2020-04-25 DIAGNOSIS — L2084 Intrinsic (allergic) eczema: Secondary | ICD-10-CM | POA: Insufficient documentation

## 2020-04-29 ENCOUNTER — Ambulatory Visit (INDEPENDENT_AMBULATORY_CARE_PROVIDER_SITE_OTHER): Payer: 59 | Admitting: Allergy

## 2020-04-29 ENCOUNTER — Encounter: Payer: Self-pay | Admitting: Allergy

## 2020-04-29 ENCOUNTER — Other Ambulatory Visit: Payer: Self-pay

## 2020-04-29 VITALS — BP 110/68 | HR 85 | Temp 98.5°F | Resp 16 | Ht 64.0 in | Wt 215.8 lb

## 2020-04-29 DIAGNOSIS — L2089 Other atopic dermatitis: Secondary | ICD-10-CM | POA: Diagnosis not present

## 2020-04-29 DIAGNOSIS — H1013 Acute atopic conjunctivitis, bilateral: Secondary | ICD-10-CM | POA: Diagnosis not present

## 2020-04-29 DIAGNOSIS — J3089 Other allergic rhinitis: Secondary | ICD-10-CM

## 2020-04-29 DIAGNOSIS — T7800XA Anaphylactic reaction due to unspecified food, initial encounter: Secondary | ICD-10-CM | POA: Diagnosis not present

## 2020-04-29 MED ORDER — EPINEPHRINE 0.3 MG/0.3ML IJ SOAJ: 0.3000 mg | INTRAMUSCULAR | 1 refills | Status: DC | PRN

## 2020-04-29 NOTE — Progress Notes (Signed)
New Patient Note  RE: Miho Monda MRN: [id number] DOB: 12-04-1990 Date of Office Visit: 04/29/2020  Referring provider: No ref. provider found Primary care provider: Patient, No Pcp Per  Chief Complaint: foods flaring eczema  History of present illness: Gina Frost is a 29 y.o. female presenting today for evaluation of possible food allergy.  She currently doesn't have a PCP.  She seens Dr. Darreld Mclean for eczema management who recommended she see an allergist for testing.   She has had eczema since around 29 years old and states she knows her body well.  Within the last 2 weeks she had an eczema flare that was not like her usual flares.  She was very itchy and red.  She went to see her dermatologist regarding this. She was given keflex course and mupirocin to apply to nares.  She also uses triamcinolone for eczema flares.  She bathes with vanicream. Moisturizers with vaseline.  She states there are some foods that make her itchy.   With shellfish ingestion she notes that the top of hands get itchy.  With shrimp specifically she states her lip has also gotten itchy.  With bread and sugar products her hands get itchy.  She states the itchiness can be present the next day however however with shellfish she can note the itch within 30 minutes.  She states sometimes she will just take a shower and moisturize or may use benadryl.   She does note in some environments or the weather changes she may have flare of her skin.   She notes itchy/watery eyes, throat itch, sometimes sneezing mostly during pollen season.  Will take benadryl at night to help these symptoms.   No history of asthma.   Review of systems: Review of Systems  Constitutional: Negative.   HENT: Negative.   Eyes: Negative.   Respiratory: Negative.   Cardiovascular: Negative.   Gastrointestinal: Negative.   Musculoskeletal: Negative.   Skin: Positive for itching and rash.  Neurological: Negative.     All other  systems negative unless noted above in HPI  Past medical history: Past Medical History:  Diagnosis Date  . Eczema     Past surgical history: Past Surgical History:  Procedure Laterality Date  . TONSILLECTOMY AND ADENOIDECTOMY      Family history:  Family History  Problem Relation Age of Onset  . Stroke Mother   . Diabetes Maternal Grandmother   . Allergic rhinitis Neg Hx   . Angioedema Neg Hx   . Asthma Neg Hx   . Atopy Neg Hx   . Eczema Neg Hx   . Immunodeficiency Neg Hx   . Urticaria Neg Hx     Social history: Lives in an apartment with carpeting with electric heating and central cooling.  No pets in home. No concern for water damage, mildew or roaches in the home.  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 Denies smoking history.   Medication List: Current Outpatient Medications  Medication Sig Dispense Refill  . cephALEXin (KEFLEX) 500 MG capsule Take one capsule by mouth twice daily with food.    . clindamycin (CLEOCIN T) 1 % external solution Apply to arms and legs twice daily x 4 weeks, then daily as needed.    . triamcinolone ointment (KENALOG) 0.1 % Apply to affected areas twice daily for 1 wk, then daily for 1 wk, then every other day as needed wks. Not to face.    Marland Kitchen EPINEPHrine 0.3 mg/0.3 mL IJ SOAJ injection Inject 0.3 mg into  the muscle as needed for anaphylaxis. As needed for life-threatening allergic reactions 2 each 1   No current facility-administered medications for this visit.    Known medication allergies: No Known Allergies   Physical examination: Blood pressure 110/68, pulse 85, temperature 98.5 F (36.9 C), temperature source Temporal, resp. rate 16, height 5\' 4"  (1.626 m), weight 215 lb 12.8 oz (97.9 kg), SpO2 98 %.  General: Alert, interactive, in no acute distress. HEENT: PERRLA, TMs pearly gray, turbinates mildly edematous without discharge L>R, post-pharynx non erythematous. Neck: Supple without lymphadenopathy. Lungs: Clear to auscultation without  wheezing, rhonchi or rales. {no increased work of breathing. CV: Normal S1, S2 without murmurs. Abdomen: Nondistended, nontender. Skin: Warm and dry, without lesions or rashes. Extremities:  No clubbing, cyanosis or edema. Neuro:   Grossly intact.  Diagnositics/Labs: Allergy testing:environmental allergy skin prick testing is positive to ky blue, meadow fescue, perennial rye, sweet vernal, oak, botrytis cinera, epicoccum nigrum, both dust mites, dog, mixed feathres, cockroach.  Select food allergy skin prick testing is positive to shellfish mix, shrimp, crab and lobster.  Negative to wheat, oyster, scallops, barley, oat, rye, saccharomyces cerevisiae.  Allergy testing results were read and interpreted by provider, documented by clinical staff.   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 including as needed use of triamcinolone and daily moisturization  - agree with option of Dupixent if topical therapies are not effective enough in controlling flares    - food allergy testing today is very positive to shrimp, crab, lobster.      Negative to gluten/bread products  - continue avoidance of shellfish  - have access to self-injectable epinephrine (Epipen or AuviQ) 0.3mg  at all times  - follow emergency action plan in case of allergic reaction   - environmental allergy testing today is positive to grass pollen, tree pollen, molds, dust mite, dog, cockroach, mixed feathers (ie. Down)   - allergen avoidance measures discussed/handouts provided  - recommend taking a long-acting antihistamine like Zyrtec 10mg , Allegra 180mg  or Xyzal 5mg  daily as needed for allergy symptom control.  These are all over-the-counter options  - for itchy/watery eyes can use over-the-counter Pataday 1 drop each eye daily as needed  - if medication management is not effective then consider  allergen immunotherapy which is a 3-5 year therapy to help re-train the body to be not allergic to the environmental allergens above.    Follow-up in 4-6 months or sooner if needed  I appreciate the opportunity to take part in Laquita's care. Please do not hesitate to contact me with questions.  Sincerely,   Darreld Mclean, MD Allergy/Immunology [facility]

## 2020-04-29 NOTE — Patient Instructions (Addendum)
 -   continue recommendations from your dermatologist, Dr. Darreld Mclean including as needed use of triamcinolone and daily moisturization  - agree with option of Dupixent if topical therapies are not effective enough in controlling flares    - food allergy testing today is very positive to shrimp, crab, lobster.      Negative to gluten/bread products  - continue avoidance of shellfish  - have access to self-injectable epinephrine (Epipen or AuviQ) 0.3mg  at all times  - follow emergency action plan in case of allergic reaction   - environmental allergy testing today is positive to grass pollen, tree pollen, molds, dust mite, dog, cockroach, mixed feathers (ie. Down)   - allergen avoidance measures discussed/handouts provided  - recommend taking a long-acting antihistamine like Zyrtec 10mg , Allegra 180mg  or Xyzal 5mg  daily as needed for allergy symptom control.  These are all over-the-counter options  - for itchy/watery eyes can use over-the-counter Pataday 1 drop each eye daily as needed  - if medication management is not effective then consider allergen immunotherapy which is a 3-5 year therapy to help re-train the body to be not allergic to the environmental allergens above.    Follow-up in 4-6 months or sooner if needed

## 2020-05-13 ENCOUNTER — Telehealth: Payer: Self-pay | Admitting: Allergy

## 2020-05-13 NOTE — Telephone Encounter (Signed)
[facility] states no info was sent with referral. Please fax to [phone]

## 2020-05-13 NOTE — Telephone Encounter (Signed)
Please advise 

## 2020-05-16 NOTE — Telephone Encounter (Signed)
I called Dr Randye Lobo office to get clarification on this message about a referral as the patient already sees this office. Their office states they do not see where anyone requested notes, but she did request me to send them on over just in case.   Fax# [phone]

## 2020-09-30 ENCOUNTER — Ambulatory Visit: Payer: 59 | Admitting: Allergy

## 2021-03-08 ENCOUNTER — Ambulatory Visit: Payer: 59 | Admitting: Podiatry

## 2021-03-08 ENCOUNTER — Other Ambulatory Visit: Payer: Self-pay

## 2021-03-08 ENCOUNTER — Other Ambulatory Visit (HOSPITAL_COMMUNITY)
Admission: RE | Admit: 2021-03-08 | Discharge: 2021-03-08 | Disposition: A | Discharge status: Home / Self Care | Payer: 59 | Source: Ambulatory Visit

## 2021-03-08 ENCOUNTER — Ambulatory Visit (INDEPENDENT_AMBULATORY_CARE_PROVIDER_SITE_OTHER): Payer: 59

## 2021-03-08 VITALS — BP 127/76 | HR 89 | Ht 65.0 in | Wt 220.7 lb

## 2021-03-08 DIAGNOSIS — Z01419 Encounter for gynecological examination (general) (routine) without abnormal findings: Secondary | ICD-10-CM | POA: Insufficient documentation

## 2021-03-08 DIAGNOSIS — Z113 Encounter for screening for infections with a predominantly sexual mode of transmission: Secondary | ICD-10-CM | POA: Diagnosis not present

## 2021-03-08 DIAGNOSIS — Z01411 Encounter for gynecological examination (general) (routine) with abnormal findings: Secondary | ICD-10-CM

## 2021-03-08 DIAGNOSIS — B3731 Acute candidiasis of vulva and vagina: Secondary | ICD-10-CM

## 2021-03-08 DIAGNOSIS — Z124 Encounter for screening for malignant neoplasm of cervix: Secondary | ICD-10-CM

## 2021-03-08 DIAGNOSIS — Z1239 Encounter for other screening for malignant neoplasm of breast: Secondary | ICD-10-CM | POA: Diagnosis not present

## 2021-03-08 DIAGNOSIS — B373 Candidiasis of vulva and vagina: Secondary | ICD-10-CM

## 2021-03-08 NOTE — Progress Notes (Addendum)
GYNECOLOGY OFFICE VISIT NOTE-WELL WOMAN EXAM  History:   Gina Frost [id number] here today for annual exam and pap smear. She endorses a history of normal pap smears.  She denies history of STDs. She denies any abnormal vaginal discharge, bleeding, or pelvic pain.  She endorses menses, but states they are "very irregular."  She states this month her LMP was 8/14 and it lasted ~3 days which is normal for her.  However, she states she had a period about 2 weeks prior to that.  She states flow is light to heavy to light.  She reports some cramping and backaches and relief with Midol when taking prn.    Birth Control:  None; Does not desire. Considering conception in the next 3-5 years.   Reproductive Concerns Partners Type: Female Number of partners in last year: One STD Testing: Desires; Full  Vaginal/GU Concerns: She denies vaginal concerns.  She reports being prone to "hair bumps."  She denies issues with urination, constipation, or diarrhea.   Breast Concerns/Exams: She denies concerns and reports "trying to" check breast every 2-3 months. She endorses breast awareness.  She denies family history of breast, uterine, cervical, or ovarian cancer.  Medical and Nutrition PCP: Establishing and has appt next week- [facility] Significant PMx: She denies history. She reports a eye condition that "involved the vessels in my eye and is unsure of the actual diagnosis. She states she has not seen her ophthalmologist "in a while."  Exercise: She does not exercise and states "I need to start back."   Tobacco/Drugs/Alcohol: She reports alcohol usage on the weekend.  She reports drinking 2-3 drinks of mixed spirits. She denies tobacco and drug usage.  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   Past Medical History:  Diagnosis Date  . Eczema   . Eczema     Past Surgical History:  Procedure Laterality Date  . TONSILLECTOMY AND ADENOIDECTOMY      The following portions of the patient's history were reviewed and updated as appropriate: allergies, current medications, past family history, past medical history, past social history, past surgical history and problem list.   Health Maintenance:  No history of abnormal pap.  No mammogram d/t age.   Review of Systems:  Pertinent items noted in HPI and remainder of comprehensive ROS otherwise negative.    Objective:    Physical Exam BP 127/76   Pulse 89   Ht 5\' 5"  (1.651 m)   Wt 220 lb 11.2 oz (100.1 kg)   LMP 02/26/2021 (Exact Date)   BMI 36.73 kg/m  Physical Exam Vitals reviewed. Exam conducted with a chaperone present.  Constitutional:      Appearance: Normal appearance.  HENT:     Head: Normocephalic and atraumatic.  Eyes:     Conjunctiva/sclera: Conjunctivae normal.  Cardiovascular:     Rate and Rhythm: Normal rate. Rhythm regularly irregular.  Pulmonary:     Effort: Pulmonary effort is normal. No respiratory distress.     Breath sounds: Normal breath sounds.  Chest:  Breasts:    Right: Normal. No bleeding, nipple discharge, skin change or tenderness.     Left: Normal. No bleeding, nipple discharge, skin change or tenderness.  Abdominal:  Palpations: Abdomen is soft.     Tenderness: There is no abdominal tenderness. There is no guarding.  Genitourinary:    Labia:        Right: No rash or tenderness.        Left: No rash or tenderness.      Vagina: Vaginal discharge present. No erythema, tenderness or bleeding.     Cervix: Friability present. No cervical motion tenderness, discharge, lesion or erythema.     Comments: Hydradenitis Suppurativa present on bilateral thighs  Pap collected with brush and  spatula. Friable with active bleeding after collection from os.  CV collected to r/o infection.  BME: Lower uterine segment with slight enlargement and questionable nodule. Mild tenderness with palpation.  Likely fibroid.  Unable to appreciate uterine size d/t position and body habitus.   Musculoskeletal:        General: Normal range of motion.     Cervical back: Normal range of motion.  Lymphadenopathy:     Upper Body:     Right upper body: No axillary adenopathy.     Left upper body: No axillary adenopathy.  Skin:    General: Skin is warm and dry.  Neurological:     Mental Status: She is alert and oriented to person, place, and time.  Psychiatric:        Mood and Affect: Mood normal.        Behavior: Behavior normal.        Thought Content: Thought content normal.     Labs and Imaging No results found for this or any previous visit (from the past 168 hour(s)). No results found.   Assessment & Plan:  30 year old Annual Exam with abnormal findings Pap Smear Due  Desires STD Screen   1. Encounter for well woman exam with routine gynecological exam -Exam performed and findings discussed. -Informed that lower uterine segment suspicious for questionable fibroids. But provider unable to fully appreciate d/t uterine position. -Discussed no further evaluation, with ultrasound, currently d/t lack of pelvic/abdominal pain, menorrhagia, or dyspareunia. Patient agreeable. -Instructed to notify office if this changes.  -Informed that provider fully supports her decision to not implement birth control! -Educated on AHA exercise recommendations of 30 minutes of moderate to vigorous activity at least 5x/week. -Educated and encouraged to initiate monthly SBE with increased breast awareness including examination of breast for skin changes, moles, tenderness, etc.   2. Routine cervical smear -Educated on ASCCP guidelines regarding pap smear evaluation and frequency. -Informed of turnover time  and provider/clinic policy on releasing results.  3. Screen for STD (sexually transmitted disease) -Full testing performed. -Discussed and agreeable to vaginal infection add-on d/t friability of cervix.    Routine preventative health maintenance measures emphasized. Please refer to After Visit Summary for other counseling recommendations.   No follow-ups on file.      Cherre Robins, CNM 03/08/2021

## 2021-03-09 LAB — CYTOLOGY - PAP
Adequacy: ABSENT
Diagnosis: NEGATIVE

## 2021-03-09 LAB — CERVICOVAGINAL ANCILLARY ONLY
Bacterial Vaginitis (gardnerella): NEGATIVE
Candida Glabrata: NEGATIVE
Candida Vaginitis: POSITIVE — AB
Chlamydia: NEGATIVE
Comment: NEGATIVE
Comment: NEGATIVE
Comment: NEGATIVE
Comment: NEGATIVE
Comment: NEGATIVE
Comment: NORMAL
Neisseria Gonorrhea: NEGATIVE
Trichomonas: NEGATIVE

## 2021-03-09 LAB — HEPATITIS C ANTIBODY: Hep C Virus Ab: <0.1 s/co ratio (ref 0.0–0.9)

## 2021-03-09 LAB — HIV ANTIBODY (ROUTINE TESTING W REFLEX): HIV Screen 4th Generation wRfx: NONREACTIVE

## 2021-03-09 LAB — HEPATITIS B SURFACE ANTIGEN: Hepatitis B Surface Ag: NEGATIVE

## 2021-03-11 MED ORDER — FLUCONAZOLE 150 MG PO TABS: 150.0000 mg | ORAL_TABLET | Freq: Every day | ORAL | 1 refills | Status: DC

## 2021-03-11 NOTE — Addendum Note (Signed)
Addended by: Gerrit Heck L on: 03/11/2021 04:25 AM   Modules accepted: Orders

## 2021-03-15 ENCOUNTER — Encounter: Payer: Self-pay | Admitting: Nurse Practitioner

## 2021-03-15 ENCOUNTER — Ambulatory Visit (INDEPENDENT_AMBULATORY_CARE_PROVIDER_SITE_OTHER): Payer: 59 | Admitting: Nurse Practitioner

## 2021-03-15 ENCOUNTER — Other Ambulatory Visit: Payer: Self-pay

## 2021-03-15 VITALS — Temp 98.0°F | Ht 65.0 in | Wt 221.0 lb

## 2021-03-15 DIAGNOSIS — E6609 Other obesity due to excess calories: Secondary | ICD-10-CM

## 2021-03-15 DIAGNOSIS — Z13228 Encounter for screening for other metabolic disorders: Secondary | ICD-10-CM | POA: Diagnosis not present

## 2021-03-15 DIAGNOSIS — Z7689 Persons encountering health services in other specified circumstances: Secondary | ICD-10-CM

## 2021-03-15 DIAGNOSIS — Z23 Encounter for immunization: Secondary | ICD-10-CM

## 2021-03-15 DIAGNOSIS — R2232 Localized swelling, mass and lump, left upper limb: Secondary | ICD-10-CM

## 2021-03-15 DIAGNOSIS — Z6836 Body mass index (BMI) 36.0-36.9, adult: Secondary | ICD-10-CM

## 2021-03-15 NOTE — Patient Instructions (Signed)
Influenza (Flu) Vaccine (Inactivated or Recombinant): What You Need to Know 1. Why get vaccinated? Influenza vaccine can prevent influenza (flu). Flu is a contagious disease that spreads around the [place] every year, usually between October and May. Anyone can get the flu, but it is more dangerous for some people. Infants and young children, people 65 years and older, pregnant people, and people with certain health conditions or a weakened immune system are at greatest risk of flu complications. Pneumonia, bronchitis, sinus infections, and ear infections are examples of flu-related complications. If you have a medical condition, such as heart disease, cancer, or diabetes, flu can make it worse. Flu can cause fever and chills, sore throat, muscle aches, fatigue, cough, headache, and runny or stuffy nose. Some people may have vomiting and diarrhea, though this is more common in children than adults. In an average year, thousands of people in the [place] die from flu, and many more are hospitalized. Flu vaccine prevents millions of illnesses and flu-related visits to the doctor each year. 2. Influenza vaccines CDC recommends everyone 6 months and older get vaccinated every flu season. Children 6 months through 8 years of age may need 2 doses during a single flu season. Everyone else needs only 1 dose each flu season. It takes about 2 weeks for protection to develop after vaccination. There are many flu viruses, and they are always changing. Each year a new flu vaccine is made to protect against the influenza viruses believed to be likely to cause disease in the upcoming flu season. Even when the vaccine doesn't exactly match these viruses, it may still provide some protection. Influenza vaccine does not cause flu. Influenza vaccine may be given at the same time as other vaccines. 3. Talk with your health care provider Tell your vaccination provider if the person getting the vaccine: Has had  an allergic reaction after a previous dose of influenza vaccine, or has any severe, life-threatening allergies Has ever had Guillain-Barr Syndrome (also called "GBS") In some cases, your health care provider may decide to postpone influenza vaccination until a future visit. Influenza vaccine can be administered at any time during pregnancy. People who are or will be pregnant during influenza season should receive inactivated influenza vaccine. People with minor illnesses, such as a cold, may be vaccinated. People who are moderately or severely ill should usually wait until they recover before getting influenza vaccine. Your health care provider can give you more information. 4. Risks of a vaccine reaction Soreness, redness, and swelling where the shot is given, fever, muscle aches, and headache can happen after influenza vaccination. There may be a very small increased risk of Guillain-Barr Syndrome (GBS) after inactivated influenza vaccine (the flu shot). Young children who get the flu shot along with pneumococcal vaccine (PCV13) and/or DTaP vaccine at the same time might be slightly more likely to have a seizure caused by fever. Tell your health care provider if a child who is getting flu vaccine has ever had a seizure. People sometimes faint after medical procedures, including vaccination. Tell your provider if you feel dizzy or have vision changes or ringing in the ears. As with any medicine, there is a very remote chance of a vaccine causing a severe allergic reaction, other serious injury, or death. 5. What if there is a serious problem? An allergic reaction could occur after the vaccinated person leaves the clinic. If you see signs of a severe allergic reaction (hives, swelling of the face and throat, difficulty breathing,   a fast heartbeat, dizziness, or weakness), call 9-1-1 and get the person to the nearest hospital. For other signs that concern you, call your health care provider. Adverse  reactions should be reported to the Vaccine Adverse Event Reporting System (VAERS). Your health care provider will usually file this report, or you can do it yourself. Visit the VAERS website at www.vaers.hhs.gov or call 1-800-822-7967. VAERS is only for reporting reactions, and VAERS staff members do not give medical advice. 6. The National Vaccine Injury Compensation Program The National Vaccine Injury Compensation Program (VICP) is a federal program that was created to compensate people who may have been injured by certain vaccines. Claims regarding alleged injury or death due to vaccination have a time limit for filing, which may be as short as two years. Visit the VICP website at www.hrsa.gov/vaccinecompensation or call 1-800-338-2382 to learn about the program and about filing a claim. 7. How can I learn more? Ask your health care provider. Call your local or state health department. Visit the website of the Food and Drug Administration (FDA) for vaccine package inserts and additional information at www.fda.gov/vaccines-blood-biologics/vaccines. Contact the Centers for Disease Control and Prevention (CDC): Call 1-800-232-4636 (1-800-CDC-INFO) or Visit CDC's website at www.cdc.gov/flu. Vaccine Information Statement Inactivated Influenza Vaccine (02/19/2020) This information is not intended to replace advice given to you by your health care provider. Make sure you discuss any questions you have with your health care provider. Document Revised: 04/07/2020 Document Reviewed: 04/07/2020 Elsevier Patient Education  2022 [employer]  

## 2021-03-15 NOTE — Progress Notes (Signed)
I,Victoria Hamilton,acting as a Education administrator for Minette Brine, FNP.,have documented all relevant documentation on the behalf of Minette Brine, FNP,as directed by  Minette Brine, FNP while in the presence of Minette Brine, [place].  This visit occurred during the SARS-CoV-2 public health emergency.  Safety protocols were in place, including screening questions prior to the visit, additional usage of staff PPE, and extensive cleaning of exam room while observing appropriate contact time as indicated for disinfecting solutions.  Subjective:     Patient ID: Gina Frost , female    DOB: 1990/09/11 , 30 y.o.   MRN: [id number]   Chief Complaint  Patient presents with   Establish Care     HPI  Patient presents today, to establish care. She has been in [place] for 4 years and did not have a PCP but had a PCP in [place]. She is married. No concerns. She does have a knot under her left arm she noticed a week ago, it does not irritate or hurt. She also wants to discuss weight loss. She has a GYN - Georgie Chard - she has been having discussions about becoming pregnant. She is not interested in birth control. Her husband is still in school and they are wanting to wait until he graduates in the next year. Social alcohol drinker.   She is working for [employer] on the [place].  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   MMR titer 04/08/2019 -   Over the last year she has gained weight after getting married. Was able to lose 15 lbs prior to her wedding. Her lowest weight was 185 lbs. She is allergic to shellfish and does not eat pork. She eats good amounts of vegetables and fruits mostly. She does notice she is snacking, she does like to bake which causes her a challenge. 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 She does ride bikes as well. She would like to lose 20-30 lbs because she is okay with how she feels at that weight. She has not  taken any medications for weight loss. She is not back to exercising. She has done intermittent fasting in the past.     Past Medical History:  Diagnosis Date   Eczema    Eczema      Family History  Problem Relation Age of Onset   Stroke Mother    Hypertension Mother    Kidney cancer Mother    Hyperlipidemia Father    Lupus Sister    Eczema Sister    Diabetes Maternal Grandmother    Allergic rhinitis Neg Hx    Angioedema Neg Hx    Asthma Neg Hx    Atopy Neg Hx    Immunodeficiency Neg Hx    Urticaria Neg Hx      Current Outpatient Medications:    EPINEPHrine 0.3 mg/0.3 mL IJ SOAJ injection, Inject 0.3 mg into the muscle as needed for anaphylaxis. As needed for life-threatening allergic reactions, Disp: 2 each, Rfl: 1   fluconazole (DIFLUCAN) 150 MG tablet, Take 1 tablet (150 mg total) by mouth daily., Disp: 1 tablet, Rfl: 1   triamcinolone ointment (KENALOG) 0.1 %, , Disp: , Rfl:    cephALEXin (KEFLEX) 500 MG capsule, Take one capsule by mouth twice daily with food. (Patient not taking: No sig reported), Disp: , Rfl:    clindamycin (CLEOCIN T) 1 % external solution, Apply to arms and legs twice daily  x 4 weeks, then daily as needed. (Patient not taking: Reported on 03/08/2021), Disp: , Rfl:    Allergies  Allergen Reactions   Shellfish Allergy Anaphylaxis     Review of Systems  Constitutional: Negative.   Respiratory: Negative.    Cardiovascular: Negative.   Neurological: Negative.   Psychiatric/Behavioral: Negative.      Today's Vitals   03/15/21 1121  Temp: 98 F (36.7 C)  Weight: 221 lb (100.2 kg)  Height: 5' 5"  (1.651 m)   Body mass index is 36.78 kg/m.  Wt Readings from Last 3 Encounters:  03/15/21 221 lb (100.2 kg)  03/08/21 220 lb 11.2 oz (100.1 kg)  04/29/20 215 lb 12.8 oz (97.9 kg)    Objective:  Physical Exam Vitals reviewed.  Constitutional:      General: She is not in acute distress.    Appearance: Normal appearance.  Cardiovascular:      Rate and Rhythm: Normal rate and regular rhythm.     Pulses: Normal pulses.     Heart sounds: Normal heart sounds. No murmur heard. Pulmonary:     Effort: Pulmonary effort is normal. No respiratory distress.     Breath sounds: Normal breath sounds. No wheezing.  Chest:     Chest wall: No mass.  Breasts:    Right: Normal. No mass or tenderness.     Left: Normal. No mass or tenderness.       Comments: Lump palpated to left axilla semi firm, slightly mobile.  Lymphadenopathy:     Upper Body:     Right upper body: No supraclavicular, axillary or pectoral adenopathy.     Left upper body: Axillary adenopathy present. No supraclavicular or pectoral adenopathy.  Skin:    General: Skin is warm and dry.     Capillary Refill: Capillary refill takes less than 2 seconds.  Neurological:     General: No focal deficit present.     Mental Status: She is alert and oriented to person, place, and time.     Cranial Nerves: No cranial nerve deficit.     Motor: No weakness.        Assessment And Plan:     1. Lump in armpit, left Comments: Semi firm lump to left axilla, slightly mobile Will check ultrasound - [place] AXILLA LEFT; Future  2. Class 2 obesity due to excess calories without serious comorbidity with body mass index (BMI) of 36.0 to 36.9 in adult Long discussion about weight loss she would like to try with diet and exercise with a goal weight loss of 20-30 lbs.  She would like to get pregnant in a few years and would like to lose weight first.  Will check for metabolic causes of weight gain - BMP8+eGFR - Hemoglobin A1c - TSH - Insulin, random  3. Encounter for screening for metabolic disorder - Hemoglobin A1c - CBC - Lipid panel  4. Encounter for immunization Influenza vaccine administered Encouraged to take Tylenol as needed for fever or muscle aches. - Flu Vaccine QUAD 6+ mos PF IM (Fluarix Quad PF)  5. Encounter to establish care    Patient was given opportunity to ask  questions. Patient verbalized understanding of the plan and was able to repeat key elements of the plan. All questions were answered to their satisfaction.  Minette Brine, FNP   I, Minette Brine, FNP, have reviewed all documentation for this visit. The documentation on 03/15/21 for the exam, diagnosis, procedures, and orders are all accurate and complete.   IF YOU  HAVE BEEN REFERRED TO A SPECIALIST, IT MAY TAKE 1-2 WEEKS TO SCHEDULE/PROCESS THE REFERRAL. IF YOU HAVE NOT HEARD FROM US/SPECIALIST IN TWO WEEKS, PLEASE GIVE [place] A CALL AT [phone].   THE PATIENT IS ENCOURAGED TO PRACTICE SOCIAL DISTANCING DUE TO THE COVID-19 PANDEMIC.

## 2021-03-16 LAB — BMP8+EGFR
BUN/Creatinine Ratio: 13 (ref 9–23)
BUN: 9 mg/dL (ref 6–20)
CO2: 25 mmol/L (ref 20–29)
Calcium: 10 mg/dL (ref 8.7–10.2)
Chloride: 98 mmol/L (ref 96–106)
Creatinine, Ser: 0.7 mg/dL (ref 0.57–1.00)
Glucose: 75 mg/dL (ref 65–99)
Potassium: 4.3 mmol/L (ref 3.5–5.2)
Sodium: 138 mmol/L (ref 134–144)
eGFR: 121 mL/min/{1.73_m2} (ref >59)

## 2021-03-16 LAB — HEMOGLOBIN A1C
Est. average glucose Bld gHb Est-mCnc: 120 mg/dL
Hgb A1c MFr Bld: 5.8 % — ABNORMAL HIGH (ref 4.8–5.6)

## 2021-03-16 LAB — LIPID PANEL
Chol/HDL Ratio: 3 ratio (ref 0.0–4.4)
Cholesterol, Total: 202 mg/dL — ABNORMAL HIGH (ref 100–199)
HDL: 67 mg/dL (ref >39)
LDL Chol Calc (NIH): 97 mg/dL (ref 0–99)
Triglycerides: 230 mg/dL — ABNORMAL HIGH (ref 0–149)
VLDL Cholesterol Cal: 38 mg/dL (ref 5–40)

## 2021-03-16 LAB — CBC
Hematocrit: 39.7 % (ref 34.0–46.6)
Hemoglobin: 13.3 g/dL (ref 11.1–15.9)
MCH: 28.1 pg (ref 26.6–33.0)
MCHC: 33.5 g/dL (ref 31.5–35.7)
MCV: 84 fL (ref 79–97)
Platelets: 254 10*3/uL (ref 150–450)
RBC: 4.74 x10E6/uL (ref 3.77–5.28)
RDW: 13.2 % (ref 11.7–15.4)
WBC: 9.3 10*3/uL (ref 3.4–10.8)

## 2021-03-16 LAB — INSULIN, RANDOM: INSULIN: 56.4 u[IU]/mL — ABNORMAL HIGH (ref 2.6–24.9)

## 2021-03-16 LAB — TSH: TSH: 1.14 u[IU]/mL (ref 0.450–4.500)

## 2021-03-28 ENCOUNTER — Encounter: Payer: Self-pay | Admitting: Nurse Practitioner

## 2021-04-01 ENCOUNTER — Encounter: Payer: Self-pay | Admitting: Nurse Practitioner

## 2021-04-04 ENCOUNTER — Ambulatory Visit
Admission: RE | Admit: 2021-04-04 | Discharge: 2021-04-04 | Disposition: A | Discharge status: Home / Self Care | Payer: 59 | Source: Ambulatory Visit | Attending: Nurse Practitioner | Admitting: Nurse Practitioner

## 2021-04-04 ENCOUNTER — Other Ambulatory Visit: Payer: Self-pay

## 2021-04-04 ENCOUNTER — Other Ambulatory Visit: Payer: Self-pay | Admitting: Nurse Practitioner

## 2021-04-04 DIAGNOSIS — R2232 Localized swelling, mass and lump, left upper limb: Secondary | ICD-10-CM

## 2021-04-24 ENCOUNTER — Ambulatory Visit
Admission: RE | Admit: 2021-04-24 | Discharge: 2021-04-24 | Disposition: A | Discharge status: Home / Self Care | Payer: 59 | Source: Ambulatory Visit | Attending: Nurse Practitioner | Admitting: Nurse Practitioner

## 2021-04-24 ENCOUNTER — Other Ambulatory Visit (HOSPITAL_COMMUNITY)
Admission: RE | Admit: 2021-04-24 | Discharge: 2021-04-24 | Disposition: A | Discharge status: Home / Self Care | Payer: 59 | Source: Ambulatory Visit | Attending: Diagnostic Radiology | Admitting: Diagnostic Radiology

## 2021-04-24 ENCOUNTER — Other Ambulatory Visit: Payer: Self-pay | Admitting: Nurse Practitioner

## 2021-04-24 ENCOUNTER — Other Ambulatory Visit: Payer: Self-pay

## 2021-04-24 DIAGNOSIS — R2232 Localized swelling, mass and lump, left upper limb: Secondary | ICD-10-CM | POA: Diagnosis present

## 2021-04-29 LAB — AEROBIC/ANAEROBIC CULTURE W GRAM STAIN (SURGICAL/DEEP WOUND)
Culture: NO GROWTH
Gram Stain: NONE SEEN

## 2021-05-11 ENCOUNTER — Encounter: Payer: 59 | Admitting: Nurse Practitioner

## 2021-06-22 ENCOUNTER — Ambulatory Visit (INDEPENDENT_AMBULATORY_CARE_PROVIDER_SITE_OTHER): Payer: 59

## 2021-06-22 ENCOUNTER — Other Ambulatory Visit: Payer: Self-pay

## 2021-06-22 VITALS — BP 119/59 | HR 90 | Wt 222.0 lb

## 2021-06-22 DIAGNOSIS — Z3201 Encounter for pregnancy test, result positive: Secondary | ICD-10-CM

## 2021-06-22 NOTE — Progress Notes (Signed)
Pt presents for UPT. UPT; positive. Pt states her LMP was 05/09/21. Pt advised to start PNV and to go to [facility] if she starts bleeding heavy like a period. Pt is scheduled for her new ob on 07/27/21.  Neelie Welshans l Carsyn Taubman, CMA

## 2021-06-22 NOTE — Progress Notes (Signed)
Patient was assessed and managed by nursing staff during this encounter. I have reviewed the chart and agree with the documentation and plan. I have also made any necessary editorial changes.  Catalina Antigua, MD 06/22/2021 12:31 PM

## 2021-06-30 ENCOUNTER — Telehealth: Payer: Self-pay

## 2021-06-30 NOTE — Telephone Encounter (Signed)
Pt is schedule for a new ob appt on 07/27/20. Pt called requesting a list of medications that are safe to take for a dry cough and sore throat. Pt was verbally given a list of medications. Understanding was voiced. Gina Frost l Gina Frost, CMA

## 2021-07-27 ENCOUNTER — Other Ambulatory Visit: Payer: Self-pay

## 2021-07-27 ENCOUNTER — Ambulatory Visit (INDEPENDENT_AMBULATORY_CARE_PROVIDER_SITE_OTHER): Payer: 59 | Admitting: Obstetrics & Gynecology

## 2021-07-27 ENCOUNTER — Other Ambulatory Visit (HOSPITAL_COMMUNITY)
Admission: RE | Admit: 2021-07-27 | Discharge: 2021-07-27 | Disposition: A | Discharge status: Home / Self Care | Payer: 59 | Source: Ambulatory Visit | Attending: Family Medicine | Admitting: Family Medicine

## 2021-07-27 ENCOUNTER — Encounter: Payer: Self-pay | Admitting: Obstetrics & Gynecology

## 2021-07-27 DIAGNOSIS — Z3400 Encounter for supervision of normal first pregnancy, unspecified trimester: Secondary | ICD-10-CM | POA: Insufficient documentation

## 2021-07-27 MED ORDER — DOXYLAMINE-PYRIDOXINE 10-10 MG PO TBEC: 2.0000 | DELAYED_RELEASE_TABLET | Freq: Every day | ORAL | 2 refills | Status: DC

## 2021-07-27 NOTE — Progress Notes (Signed)
DATING AND VIABILITY SONOGRAM   Gina Frost is a 31 y.o. year old G1P0000 with LMP Patient's last menstrual period was 04/30/2021. which would correlate to  [redacted]w[redacted]d weeks gestation.  She has regular menstrual cycles.   She is here today for a confirmatory initial sonogram.    GESTATION: SINGLETOn  FETAL ACTIVITY:          Heart rate         143 bpm          The fetus is active.  ADNEXA: The ovaries are normal.   GESTATIONAL AGE AND  BIOMETRICS:  Gestational criteria: Estimated Date of Delivery: 02/04/22 by LMP now at [redacted]w[redacted]d  Previous Scans:0      CROWN RUMP LENGTH          5.67 cm        12-2weeks     6.23 cm    12-4 weeks   6.01 cm     12-4 weeks                                                                       AVERAGE EGA(BY THIS SCAN):  12-4 weeks  WORKING EDD( LMP ):  02/04/22     TECHNICIAN COMMENTS:  Patient informed that the ultrasound is considered a limited obstetric ultrasound and is not intended to be a complete ultrasound exam.  Patient also informed that the ultrasound is not being completed with the intent of assessing for fetal or placental anomalies or any pelvic abnormalities. Explained that the purpose of today's ultrasound is to assess for fetal heart rate.  Patient acknowledges the purpose of the exam and the limitations of the study.     Armandina Stammer 07/27/2021 2:42 PM

## 2021-07-27 NOTE — Progress Notes (Signed)
°  Subjective:    Gina Frost is a G1P0000 [redacted]w[redacted]d being seen today for her first obstetrical visit.  Her obstetrical history is significant for  none . Patient does intend to breast feed. Pregnancy history fully reviewed.  Patient reports  insomnia, nausea midday/late afternoon .  Vitals:   07/27/21 1314 07/27/21 1320  BP: (!) 108/49 111/61  Pulse: 80 82  Weight: 221 lb (100.2 kg)     HISTORY: OB History  Gravida Para Term Preterm AB Living  1 0 0 0 0 0  SAB IAB Ectopic Multiple Live Births  0 0 0 0 0    # Outcome Date GA Lbr Len/2nd Weight Sex Delivery Anes PTL Lv  1 Current            Past Medical History:  Diagnosis Date   Eczema    Eczema    Past Surgical History:  Procedure Laterality Date   TONSILLECTOMY AND ADENOIDECTOMY     Family History  Problem Relation Age of Onset   Stroke Mother    Hypertension Mother    Kidney cancer Mother    Hyperlipidemia Father    Lupus Sister    Eczema Sister    Diabetes Maternal Grandmother    Allergic rhinitis Neg Hx    Angioedema Neg Hx    Asthma Neg Hx    Atopy Neg Hx    Immunodeficiency Neg Hx    Urticaria Neg Hx      Exam    Uterus:     Pelvic Exam:    Perineum: No Hemorrhoids   Vulva: normal   Vagina:  normal mucosa   pH: N/a   Cervix: no cervical motion tenderness and no lesions   Adnexa: normal adnexa   Bony Pelvis: average  System: Breast:  normal appearance, no masses or tenderness   Skin: normal coloration and turgor, no rashes, scars from HS on inner thighs    Neurologic: oriented, normal mood   Extremities: normal strength, tone, and muscle mass   HEENT sclera clear, anicteric, oropharynx clear, no lesions, neck supple with midline trachea, and thyroid without masses   Mouth/Teeth mucous membranes moist, pharynx normal without lesions and dental hygiene good   Neck supple and no masses   Cardiovascular: regular rate and rhythm   Respiratory:  appears well, vitals normal, no respiratory  distress, acyanotic, normal RR, neck free of mass or lymphadenopathy, chest clear, no wheezing, crepitations, rhonchi, normal symmetric air entry   Abdomen: soft, non-tender; bowel sounds normal; no masses,  no organomegaly   Urinary: urethral meatus normal      Assessment:    Pregnancy: G1P0000 Patient Active Problem List   Diagnosis Date Noted   Supervision of normal first pregnancy, antepartum 07/27/2021   Intrinsic atopic dermatitis 04/25/2020   Hirsutism 05/27/2019        Plan:     Initial labs drawn. Prenatal vitamins. Problem list reviewed and updated. Genetic Screening:  NIPS today and AFP next visit Anatomy US: Ordered for 18-20 weeks Dating by bedside US today.  Pt has very irregular menses and unsure of LMP. 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  Baby Rx APP only per patient request Diglegis for sleep and nausea BMI 36-->discussed 12-20 pound weight gain RTC 4 weeks   Silas [place] 07/27/2021

## 2021-07-28 LAB — CBC/D/PLT+RPR+RH+ABO+RUBIGG...
Antibody Screen: NEGATIVE
Basophils Absolute: 0 10*3/uL (ref 0.0–0.2)
Basos: 0 %
EOS (ABSOLUTE): 0.2 10*3/uL (ref 0.0–0.4)
Eos: 2 %
HCV Ab: <0.1 s/co ratio (ref 0.0–0.9)
HIV Screen 4th Generation wRfx: NONREACTIVE
Hematocrit: 36.6 % (ref 34.0–46.6)
Hemoglobin: 12.7 g/dL (ref 11.1–15.9)
Hepatitis B Surface Ag: NEGATIVE
Immature Grans (Abs): 0.1 10*3/uL (ref 0.0–0.1)
Immature Granulocytes: 1 %
Lymphocytes Absolute: 2.3 10*3/uL (ref 0.7–3.1)
Lymphs: 23 %
MCH: 28.1 pg (ref 26.6–33.0)
MCHC: 34.7 g/dL (ref 31.5–35.7)
MCV: 81 fL (ref 79–97)
Monocytes Absolute: 0.8 10*3/uL (ref 0.1–0.9)
Monocytes: 7 %
Neutrophils Absolute: 6.8 10*3/uL (ref 1.4–7.0)
Neutrophils: 67 %
Platelets: 232 10*3/uL (ref 150–450)
RBC: 4.52 x10E6/uL (ref 3.77–5.28)
RDW: 13.4 % (ref 11.7–15.4)
RPR Ser Ql: NONREACTIVE
Rh Factor: POSITIVE
Rubella Antibodies, IGG: 5.6 index (ref >0.99)
WBC: 10.1 10*3/uL (ref 3.4–10.8)

## 2021-07-28 LAB — HEMOGLOBIN A1C
Est. average glucose Bld gHb Est-mCnc: 117 mg/dL
Hgb A1c MFr Bld: 5.7 % — ABNORMAL HIGH (ref 4.8–5.6)

## 2021-07-28 LAB — HCV INTERPRETATION

## 2021-07-29 LAB — URINE CULTURE, OB REFLEX

## 2021-07-29 LAB — CULTURE, OB URINE

## 2021-07-31 LAB — GC/CHLAMYDIA PROBE AMP (~~LOC~~) NOT AT ARMC
Chlamydia: NEGATIVE
Comment: NEGATIVE
Comment: NORMAL
Neisseria Gonorrhea: NEGATIVE

## 2021-08-07 ENCOUNTER — Other Ambulatory Visit: Payer: 59

## 2021-08-07 ENCOUNTER — Other Ambulatory Visit: Payer: Self-pay

## 2021-08-07 DIAGNOSIS — Z3A14 14 weeks gestation of pregnancy: Secondary | ICD-10-CM

## 2021-08-07 DIAGNOSIS — R7309 Other abnormal glucose: Secondary | ICD-10-CM

## 2021-08-07 NOTE — Progress Notes (Signed)
Pt presents for 2 hr GTT due to elevated Hemoglobin A1C. Pt was given lab slip and sent to the lab. Pt was also given a copy of her Panorama results. Taray Normoyle l Tammala Weider, CMA

## 2021-08-08 ENCOUNTER — Telehealth: Payer: Self-pay

## 2021-08-08 ENCOUNTER — Other Ambulatory Visit: Payer: Self-pay

## 2021-08-08 DIAGNOSIS — D573 Sickle-cell trait: Secondary | ICD-10-CM

## 2021-08-08 LAB — GLUCOSE TOLERANCE, 2 HOURS W/ 1HR
Glucose, 1 hour: 142 mg/dL (ref 70–179)
Glucose, 2 hour: 117 mg/dL (ref 70–152)
Glucose, Fasting: 91 mg/dL (ref 70–91)

## 2021-08-08 NOTE — Telephone Encounter (Signed)
-----   Message from Guss Bunde, MD sent at 08/07/2021  4:39 PM EST ----- Hi, Can you please refer for genetic counseling for +sickle cell carrier  Thanks!

## 2021-08-08 NOTE — Telephone Encounter (Signed)
Called pt to inform her of positive sickle carrier results. Left message for pt to call the office back. Brion Hedges l Chang Tiggs, CMA

## 2021-08-08 NOTE — Telephone Encounter (Signed)
Spoke to pt. Pt made aware that she is a carrier for sickle cell and a referral for Genetic counseling has been ordered. Understanding was voiced. Berna Gitto l Lucile Hillmann, CMA

## 2021-08-23 ENCOUNTER — Ambulatory Visit: Payer: Self-pay | Admitting: Genetics

## 2021-08-23 ENCOUNTER — Other Ambulatory Visit: Payer: Self-pay

## 2021-08-23 ENCOUNTER — Ambulatory Visit: Payer: 59 | Attending: Obstetrics and Gynecology

## 2021-08-23 ENCOUNTER — Ambulatory Visit (INDEPENDENT_AMBULATORY_CARE_PROVIDER_SITE_OTHER): Payer: 59 | Admitting: Family Medicine

## 2021-08-23 VITALS — BP 121/70 | HR 93 | Wt 226.0 lb

## 2021-08-23 DIAGNOSIS — D573 Sickle-cell trait: Secondary | ICD-10-CM | POA: Insufficient documentation

## 2021-08-23 DIAGNOSIS — Z3400 Encounter for supervision of normal first pregnancy, unspecified trimester: Secondary | ICD-10-CM

## 2021-08-23 NOTE — Addendum Note (Signed)
Addended by: Levie Heritage on: 08/23/2021 12:16 PM   Modules accepted: Orders

## 2021-08-23 NOTE — Progress Notes (Signed)
° °  PRENATAL VISIT NOTE  Subjective:  Gina Frost is a 31 y.o. G1P0000 at [redacted]w[redacted]d being seen today for ongoing prenatal care.  She is currently monitored for the following issues for this low-risk pregnancy and has Hirsutism; Intrinsic atopic dermatitis; and Supervision of normal first pregnancy, antepartum on their problem list.  Patient reports  "lightening crotch" about every other night. Just a brief pain, then goes away . Some round ligament pain.  Contractions: Not present. Vag. Bleeding: None.   . Denies leaking of fluid.   The following portions of the patient's history were reviewed and updated as appropriate: allergies, current medications, past family history, past medical history, past social history, past surgical history and problem list.   Objective:   Vitals:   08/23/21 1119  BP: 121/70  Pulse: 93  Weight: 226 lb (102.5 kg)    Fetal Status: Fetal Heart Rate (bpm): 151 Fundal Height: 16 cm       General:  Alert, oriented and cooperative. Patient is in no acute distress.  Skin: Skin is warm and dry. No rash noted.   Cardiovascular: Normal heart rate noted  Respiratory: Normal respiratory effort, no problems with respiration noted  Abdomen: Soft, gravid, appropriate for gestational age.        Pelvic: Cervical exam deferred        Extremities: Normal range of motion.  Edema: None  Mental Status: Normal mood and affect. Normal behavior. Normal judgment and thought content.   Assessment and Plan:  Pregnancy: G1P0000 at [redacted]w[redacted]d 1. Supervision of normal first pregnancy, antepartum FHT and FH normal. Abdominal support band. Declined AFP.  2. Sickle cell trait Discussed FOB testing. Info to [employer] and [facility] agency given.  Preterm labor symptoms and general obstetric precautions including but not limited to vaginal bleeding, contractions, leaking of fluid and fetal movement were reviewed in detail with the patient. Please refer to After Visit Summary  for other counseling recommendations.   No follow-ups on file.  Future Appointments  Date Time Provider Department Center  08/23/2021  2:30 PM [facility] GENETIC COUNSELING [facility]  09/13/2021 10:30 AM [facility] NURSE [facility]  09/13/2021 10:45 AM [facility] US5 [facility]  09/21/2021 11:15 AM Adrian Blackwater, Rhona Raider, DO [facility] None    Levie Heritage, DO

## 2021-08-23 NOTE — Patient Instructions (Signed)
[facility] [address] [place],  [address] [phone] Toll Free: [phone]   Testing on Thursdays  

## 2021-08-23 NOTE — Progress Notes (Signed)
ROB

## 2021-08-23 NOTE — Progress Notes (Signed)
Name: Gina Frost Indication: Maternal Sickle Cell Trait  DOB: [birth year] Age: 31 y.o.   EDC: 02/04/2022 LMP: Not known Referring Provider:  Raelyn Mora, CNM  EGA: [redacted]w[redacted]d Genetic Counselor: Teena Dunk, MS, CGC  OB Hx: G1P0 Date of Appointment: 08/23/2021  Accompanied by: Father of the current pregnancy, Gina Frost Face to Face Time: 30 Minutes   Previous Testing Completed: Cathryn previously completed Non-Invasive Prenatal Screening (NIPS) in this pregnancy (scanned into Epic under the Media tab). The result is low risk, consistent with a female fetus. This screening significantly reduces the risk that the current pregnancy has Down syndrome, Trisomy 50, Trisomy 13, Monosomy X, and Triploidy, however, the risk is not zero given the limitations of NIPS. Additionally, there are many genetic conditions that cannot be detected by NIPS.  Gina Frost previously completed carrier screening (scanned into Epic under the Media tab). She screened to be a carrier for Sickle Cell Disease. She screened to not be a carrier for Cystic Fibrosis (CF), Spinal Muscular Atrophy (SMA), \alpha thalassemia, and other beta hemoglobinopathies. A negative result on carrier screening reduces the likelihood of being a carrier, however, does not entirely rule out the possibility.   Medical History:  Reports she takes prenatal vitamins. Denies personal history of diabetes, high blood pressure, thyroid conditions, and seizures. Denies bleeding, infections, and fevers in this pregnancy. Denies using tobacco, alcohol, or street drugs in this pregnancy.   Family History: A pedigree was created and scanned into Epic under the Media tab. Gina Frost reports her maternal cousin is affected with Sickle Cell Disease.  Gina Frost reports a different maternal cousin is affected with Down syndrome.  Maternal ethnicity reported as African American and paternal ethnicity reported as African American. Denies Ashkenazi Jewish  ancestry. Family history not remarkable for consanguinity, individuals with birth defects, autism spectrum disorder, multiple spontaneous abortions, still births, or unexplained neonatal death.     Genetic Counseling:   Carrier of Sickle Cell Disease. Aisling is a carrier of sickle cell trait (Hemoglobin S: Hb A/S). Hemoglobin S is a structural hemoglobin variant that replaces one of the ?-chains of hemoglobin. It is caused by mutations in the HBB gene. Carriers of sickle cell trait typically do not have any clinical features, however, may experience sickling under extreme conditions. Individuals who carry sickle cell trait would have a 25% risk to have offspring affected with Sickle Cell Disease if their reproductive partner is also found to be a carrier for a ?-globin chain abnormality (recessive inheritance). Sickle Cell Disease caused by the homozygous HBB variant p.Glu6Val (Hb S/S), is the most common cause of Sickle Cell Disease in the [place]. Other Sickle Cell Disorders caused by compound heterozygous HBB pathogenic variants includes Sickle-Hemoglobin C Disease (Hb S/C) and two types of Sickle ?-Thalassemia (Hb S/?+ and Hb S/?). Other ?-globin chain variants such as D-Punjab, O-Arab, and E also result in Sickle Cell Disorders when inherited with HbS.  Most individuals with Sickle Cell Disease are healthy at birth and become symptomatic later in infancy or childhood after fetal hemoglobin levels decrease. Symptoms include infants with spontaneous painful swelling of the hands/feet, recurrent episodes of severe pain with no other identified etiology, unexplained anemia not related to iron deficiency, pallor, jaundice, pneumococcal sepsis or meningitis, severe anemia with splenic enlargement, childhood stroke, etc. Given that Billie is a carrier of sickle cell trait (Hb A/S) genetic counseling recommended screening Gina Frost for ? Hemoglobinopathies. Genetic counseling reviewed that given Gina Frost's  African American ancestry, his risk to be  a carrier for Sickle Cell Disease is approximately 1 in 10. Gina Frost verbalized understanding of the information discussed and accepted screening for ? Hemoglobinopathies. His blood was drawn for a hemoglobin fractionation cascade today.  Distant Family History of Down syndrome. Given Monque's family history of Down syndrome in a maternal cousin, we reviewed that most cases of Down syndrome are isolated and not inherited. A small proportion of cases are inherited from parents carrying balanced translocations. Without knowing the karyotype results or exactly what type of Down syndrome the affected relative has, an unbalanced rearrangement causing Down syndrome cannot be completely ruled out. Given Aicha's low risk NIPS result it is highly unlikely that the current pregnancy is affected with Down syndrome.     Patient Plan:  Proceed with: Hemoglobin fractionation cascade for Gina Frost Informed consent was obtained. All questions were answered.    Thank you for sharing in the care of Gina Frost with [place].  Please do not hesitate to contact us if you have any questions.  Teena Dunk, MS, [facility]

## 2021-08-30 ENCOUNTER — Ambulatory Visit: Payer: 59 | Admitting: Nurse Practitioner

## 2021-09-13 ENCOUNTER — Other Ambulatory Visit: Payer: Self-pay | Admitting: *Deleted

## 2021-09-13 ENCOUNTER — Other Ambulatory Visit: Payer: Self-pay

## 2021-09-13 ENCOUNTER — Encounter: Payer: Self-pay | Admitting: *Deleted

## 2021-09-13 ENCOUNTER — Ambulatory Visit: Payer: 59 | Admitting: *Deleted

## 2021-09-13 ENCOUNTER — Ambulatory Visit: Payer: 59 | Attending: Obstetrics & Gynecology

## 2021-09-13 VITALS — BP 107/50 | HR 79

## 2021-09-13 DIAGNOSIS — O4442 Low lying placenta NOS or without hemorrhage, second trimester: Secondary | ICD-10-CM | POA: Insufficient documentation

## 2021-09-13 DIAGNOSIS — Z148 Genetic carrier of other disease: Secondary | ICD-10-CM | POA: Insufficient documentation

## 2021-09-13 DIAGNOSIS — Z363 Encounter for antenatal screening for malformations: Secondary | ICD-10-CM | POA: Insufficient documentation

## 2021-09-13 DIAGNOSIS — Z3A19 19 weeks gestation of pregnancy: Secondary | ICD-10-CM | POA: Diagnosis not present

## 2021-09-13 DIAGNOSIS — Z3689 Encounter for other specified antenatal screening: Secondary | ICD-10-CM | POA: Insufficient documentation

## 2021-09-13 DIAGNOSIS — O99212 Obesity complicating pregnancy, second trimester: Secondary | ICD-10-CM | POA: Diagnosis not present

## 2021-09-13 DIAGNOSIS — Z3400 Encounter for supervision of normal first pregnancy, unspecified trimester: Secondary | ICD-10-CM

## 2021-09-21 ENCOUNTER — Ambulatory Visit (INDEPENDENT_AMBULATORY_CARE_PROVIDER_SITE_OTHER): Payer: 59 | Admitting: Family Medicine

## 2021-09-21 ENCOUNTER — Other Ambulatory Visit: Payer: Self-pay

## 2021-09-21 VITALS — BP 118/64 | HR 84 | Wt 227.0 lb

## 2021-09-21 DIAGNOSIS — O4442 Low lying placenta NOS or without hemorrhage, second trimester: Secondary | ICD-10-CM

## 2021-09-21 DIAGNOSIS — L2084 Intrinsic (allergic) eczema: Secondary | ICD-10-CM

## 2021-09-21 DIAGNOSIS — D573 Sickle-cell trait: Secondary | ICD-10-CM

## 2021-09-21 DIAGNOSIS — Z3400 Encounter for supervision of normal first pregnancy, unspecified trimester: Secondary | ICD-10-CM

## 2021-09-21 DIAGNOSIS — Z3A20 20 weeks gestation of pregnancy: Secondary | ICD-10-CM

## 2021-09-21 NOTE — Progress Notes (Signed)
? ?  PRENATAL VISIT NOTE ? ?Subjective:  ?Gina Frost is a 31 y.o. G1P0000 at [redacted]w[redacted]d being seen today for ongoing prenatal care.  She is currently monitored for the following issues for this high-risk pregnancy and has Hirsutism; Intrinsic atopic dermatitis; Supervision of normal first pregnancy, antepartum; and Sickle-cell trait ([place]) on their problem list. ? ?Patient reports no complaints.  Contractions: Not present. Vag. Bleeding: None.  Movement: Present. Denies leaking of fluid.  ? ?The following portions of the patient's history were reviewed and updated as appropriate: allergies, current medications, past family history, past medical history, past social history, past surgical history and problem list.  ? ?Objective:  ? ?Vitals:  ? 09/21/21 1120  ?BP: 118/64  ?Pulse: 84  ?Weight: 227 lb (103 kg)  ? ? ?Fetal Status: Fetal Heart Rate (bpm): 155   Movement: Present    ? ?General:  Alert, oriented and cooperative. Patient is in no acute distress.  ?Skin: Skin is warm and dry. No rash noted.   ?Cardiovascular: Normal heart rate noted  ?Respiratory: Normal respiratory effort, no problems with respiration noted  ?Abdomen: Soft, gravid, appropriate for gestational age.  Pain/Pressure: Absent     ?Pelvic: Cervical exam deferred        ?Extremities: Normal range of motion.  Edema: None  ?Mental Status: Normal mood and affect. Normal behavior. Normal judgment and thought content.  ? ?Assessment and Plan:  ?Pregnancy: G1P0000 at [redacted]w[redacted]d ?1. [redacted] weeks gestation of pregnancy ? ?2. Supervision of normal first pregnancy, antepartum ?FHT and FH normal ? ?3. Sickle-cell trait ([place]) ? ?4. Intrinsic atopic dermatitis ?Having increased symptoms on hands bilaterally. Normally uses triamcinolone. Will have her increase use to twice a day. She will also contact dermatologist. ? ?5. Low-lying placenta in second trimester ?Rpt [place] around 28 weeks. ? ?Preterm labor symptoms and general obstetric precautions including but not limited to  vaginal bleeding, contractions, leaking of fluid and fetal movement were reviewed in detail with the patient. ?Please refer to After Visit Summary for other counseling recommendations.  ? ?No follow-ups on file. ? ?Future Appointments  ?Date Time Provider [place]  ?11/15/2021 11:15 AM [facility] NURSE [facility]  ?11/15/2021 11:30 AM [facility] US3 [facility]  ? ? ?Truett Mainland, DO ?

## 2021-09-28 ENCOUNTER — Encounter: Payer: Self-pay | Admitting: Family Medicine

## 2021-10-19 ENCOUNTER — Ambulatory Visit (INDEPENDENT_AMBULATORY_CARE_PROVIDER_SITE_OTHER): Payer: 59 | Admitting: Family Medicine

## 2021-10-19 VITALS — BP 111/51 | HR 85 | Wt 229.0 lb

## 2021-10-19 DIAGNOSIS — Z3400 Encounter for supervision of normal first pregnancy, unspecified trimester: Secondary | ICD-10-CM

## 2021-10-19 DIAGNOSIS — Z3A24 24 weeks gestation of pregnancy: Secondary | ICD-10-CM

## 2021-10-19 DIAGNOSIS — D573 Sickle-cell trait: Secondary | ICD-10-CM

## 2021-10-19 NOTE — Progress Notes (Signed)
? ?  PRENATAL VISIT NOTE ? ?Subjective:  ?Gina Frost is a 31 y.o. G1P0000 at [redacted]w[redacted]d being seen today for ongoing prenatal care.  She is currently monitored for the following issues for this low-risk pregnancy and has Hirsutism; Intrinsic atopic dermatitis; Supervision of normal first pregnancy, antepartum; and Sickle-cell trait ([place]) on their problem list. ? ?Patient reports  Has decreased appetite for the past 2 weeks. Still eating, but just doesn't feel hungry. Has increased fatigue .  Contractions: Not present. Vag. Bleeding: None.  Movement: Present. Denies leaking of fluid.  ? ?The following portions of the patient's history were reviewed and updated as appropriate: allergies, current medications, past family history, past medical history, past social history, past surgical history and problem list.  ? ?Objective:  ? ?Vitals:  ? 10/19/21 1419  ?BP: (!) 111/51  ?Pulse: 85  ?Weight: 229 lb (103.9 kg)  ? ? ?Fetal Status: Fetal Heart Rate (bpm): 154   Movement: Present    ? ?General:  Alert, oriented and cooperative. Patient is in no acute distress.  ?Skin: Skin is warm and dry. No rash noted.   ?Cardiovascular: Normal heart rate noted  ?Respiratory: Normal respiratory effort, no problems with respiration noted  ?Abdomen: Soft, gravid, appropriate for gestational age.  Pain/Pressure: Absent     ?Pelvic: Cervical exam deferred        ?Extremities: Normal range of motion.  Edema: None  ?Mental Status: Normal mood and affect. Normal behavior. Normal judgment and thought content.  ? ?Assessment and Plan:  ?Pregnancy: G1P0000 at [redacted]w[redacted]d ?1. [redacted] weeks gestation of pregnancy ? ? ?2. Supervision of normal first pregnancy, antepartum ?FHT and FH normal. Will watch appetite ? ?3. Sickle-cell trait ([place]) ? ? ? ?Preterm labor symptoms and general obstetric precautions including but not limited to vaginal bleeding, contractions, leaking of fluid and fetal movement were reviewed in detail with the patient. ?Please refer to  After Visit Summary for other counseling recommendations.  ? ?No follow-ups on file. ? ?Future Appointments  ?Date Time Provider [place]  ?11/15/2021 11:15 AM [facility] NURSE [facility]  ?11/15/2021 11:30 AM [facility] US3 [facility]  ?11/17/2021  8:15 AM Nehemiah Settle, Tanna Savoy, DO [facility] None  ?11/30/2021  1:30 PM Truett Mainland, DO [facility] None  ?12/14/2021 10:15 AM Woodroe Mode, MD [facility] None  ? ? ?Truett Mainland, DO ?

## 2021-11-04 ENCOUNTER — Emergency Department (HOSPITAL_BASED_OUTPATIENT_CLINIC_OR_DEPARTMENT_OTHER)
Admission: EM | Admit: 2021-11-04 | Discharge: 2021-11-04 | Disposition: A | Discharge status: Home / Self Care | Payer: 59 | Attending: Emergency Medicine | Admitting: Emergency Medicine

## 2021-11-04 ENCOUNTER — Encounter (HOSPITAL_BASED_OUTPATIENT_CLINIC_OR_DEPARTMENT_OTHER): Payer: Self-pay | Admitting: Emergency Medicine

## 2021-11-04 ENCOUNTER — Other Ambulatory Visit: Payer: Self-pay

## 2021-11-04 DIAGNOSIS — Z3A26 26 weeks gestation of pregnancy: Secondary | ICD-10-CM | POA: Diagnosis not present

## 2021-11-04 DIAGNOSIS — M549 Dorsalgia, unspecified: Secondary | ICD-10-CM | POA: Diagnosis not present

## 2021-11-04 DIAGNOSIS — O26892 Other specified pregnancy related conditions, second trimester: Secondary | ICD-10-CM | POA: Insufficient documentation

## 2021-11-04 DIAGNOSIS — R109 Unspecified abdominal pain: Secondary | ICD-10-CM | POA: Insufficient documentation

## 2021-11-04 LAB — COMPREHENSIVE METABOLIC PANEL
ALT: 16 U/L (ref 0–44)
AST: 19 U/L (ref 15–41)
Albumin: 3.1 g/dL — ABNORMAL LOW (ref 3.5–5.0)
Alkaline Phosphatase: 63 U/L (ref 38–126)
Anion gap: 7 (ref 5–15)
BUN: 6 mg/dL (ref 6–20)
CO2: 22 mmol/L (ref 22–32)
Calcium: 8.7 mg/dL — ABNORMAL LOW (ref 8.9–10.3)
Chloride: 107 mmol/L (ref 98–111)
Creatinine, Ser: 0.5 mg/dL (ref 0.44–1.00)
GFR, Estimated: >60 mL/min (ref >60)
Glucose, Bld: 115 mg/dL — ABNORMAL HIGH (ref 70–99)
Potassium: 3.4 mmol/L — ABNORMAL LOW (ref 3.5–5.1)
Sodium: 136 mmol/L (ref 135–145)
Total Bilirubin: 0.3 mg/dL (ref 0.3–1.2)
Total Protein: 6.3 g/dL — ABNORMAL LOW (ref 6.5–8.1)

## 2021-11-04 LAB — URINALYSIS, MICROSCOPIC (REFLEX)

## 2021-11-04 LAB — CBC WITH DIFFERENTIAL/PLATELET
Abs Immature Granulocytes: 0.12 10*3/uL — ABNORMAL HIGH (ref 0.00–0.07)
Basophils Absolute: 0 10*3/uL (ref 0.0–0.1)
Basophils Relative: 0 %
Eosinophils Absolute: 0.2 10*3/uL (ref 0.0–0.5)
Eosinophils Relative: 2 %
HCT: 33.5 % — ABNORMAL LOW (ref 36.0–46.0)
Hemoglobin: 11.6 g/dL — ABNORMAL LOW (ref 12.0–15.0)
Immature Granulocytes: 1 %
Lymphocytes Relative: 19 %
Lymphs Abs: 2.1 10*3/uL (ref 0.7–4.0)
MCH: 28.3 pg (ref 26.0–34.0)
MCHC: 34.6 g/dL (ref 30.0–36.0)
MCV: 81.7 fL (ref 80.0–100.0)
Monocytes Absolute: 0.8 10*3/uL (ref 0.1–1.0)
Monocytes Relative: 7 %
Neutro Abs: 7.7 10*3/uL (ref 1.7–7.7)
Neutrophils Relative %: 71 %
Platelets: 208 10*3/uL (ref 150–400)
RBC: 4.1 MIL/uL (ref 3.87–5.11)
RDW: 14.4 % (ref 11.5–15.5)
WBC: 11 10*3/uL — ABNORMAL HIGH (ref 4.0–10.5)
nRBC: 0 % (ref 0.0–0.2)

## 2021-11-04 LAB — URINALYSIS, ROUTINE W REFLEX MICROSCOPIC
Bilirubin Urine: NEGATIVE
Glucose, UA: >=500 mg/dL — AB
Hgb urine dipstick: NEGATIVE
Ketones, ur: NEGATIVE mg/dL
Leukocytes,Ua: NEGATIVE
Nitrite: NEGATIVE
Protein, ur: NEGATIVE mg/dL
Specific Gravity, Urine: 1.015 (ref 1.005–1.030)
pH: 6 (ref 5.0–8.0)

## 2021-11-04 NOTE — Progress Notes (Signed)
Received a call from [facility] ED. Pt is a G1P0 at 27 6/[redacted] weeks gestation presenting with c/o epigastric pain since last night not relieved by tums. No vaginal bleeding or leaking of fluid. No c/o uc's. She gets her care with our [facility].  ?

## 2021-11-04 NOTE — ED Provider Notes (Signed)
?[facility] EMERGENCY DEPARTMENT ?Provider Note ? ? ?CSN: [id number] ?Arrival date & time: 11/04/21  8242 ? ?  ? ?History ? ?Chief Complaint  ?Patient presents with  ? Abdominal Pain  ? ? ?Gina Frost is a 31 y.o. female. ? ?Patient here with abdominal pain, [redacted] weeks pregnant.  Thus far no issues with pregnancy.  Fetal scan have been unremarkable.  Having some upper abdominal pain intermittent back pain.  Denies any vaginal bleeding, vaginal discharge, water leakage, contractions.  Has had good fetal movement.  Denies any reflux symptoms.  Denies any nausea or vomiting.  No headaches, no vision changes. ? ?The history is provided by the patient.  ?Abdominal Pain ? ?  ? ?Home Medications ?Prior to Admission medications   ?Medication Sig Start Date End Date Taking? Authorizing Provider  ?EPINEPHrine 0.3 mg/0.3 mL IJ SOAJ injection Inject 0.3 mg into the muscle as needed for anaphylaxis. As needed for life-threatening allergic reactions 04/29/20   Marcelyn Bruins, MD  ?Prenatal Vit-Fe Fumarate-FA (PRENATAL VITAMINS PO) Take by mouth.    [provider]  ?triamcinolone (KENALOG) 0.025 % ointment Apply 1 application topically 3 times/day as needed-between meals & bedtime.    [provider]  ?   ? ?Allergies    ?Shellfish allergy   ? ?Review of Systems   ?Review of Systems  ?Gastrointestinal:  Positive for abdominal pain.  ? ?Physical Exam ?Updated Vital Signs ?BP 112/62   Pulse 97   Temp 98.8 ?F (37.1 ?C) (Oral)   Resp (!) 24   LMP 04/30/2021   SpO2 98%  ?Physical Exam ?Vitals and nursing note reviewed.  ?Constitutional:   ?   General: She is not in acute distress. ?   Appearance: She is well-developed. She is not ill-appearing.  ?HENT:  ?   Head: Normocephalic and atraumatic.  ?   Nose: Nose normal.  ?   Mouth/Throat:  ?   Mouth: Mucous membranes are moist.  ?Eyes:  ?   Extraocular Movements: Extraocular movements intact.  ?   Conjunctiva/sclera: Conjunctivae normal.  ?    Pupils: Pupils are equal, round, and reactive to light.  ?Cardiovascular:  ?   Rate and Rhythm: Normal rate and regular rhythm.  ?   Pulses: Normal pulses.  ?   Heart sounds: Normal heart sounds. No murmur heard. ?Pulmonary:  ?   Effort: Pulmonary effort is normal. No respiratory distress.  ?   Breath sounds: Normal breath sounds.  ?Abdominal:  ?   Palpations: Abdomen is soft.  ?   Tenderness: There is no abdominal tenderness.  ?Musculoskeletal:     ?   General: No swelling.  ?   Cervical back: Normal range of motion and neck supple.  ?Skin: ?   General: Skin is warm and dry.  ?   Capillary Refill: Capillary refill takes less than 2 seconds.  ?Neurological:  ?   General: No focal deficit present.  ?   Mental Status: She is alert.  ?Psychiatric:     ?   Mood and Affect: Mood normal.  ? ? ?ED Results / Procedures / Treatments   ?Labs ?(all labs ordered are listed, but only abnormal results are displayed) ?Labs Reviewed  ?URINALYSIS, ROUTINE W REFLEX MICROSCOPIC  ?CBC WITH DIFFERENTIAL/PLATELET  ?COMPREHENSIVE METABOLIC PANEL  ? ? ?EKG ?None ? ?Radiology ?No results found. ? ?Procedures ?Procedures  ? ? ?Medications Ordered in ED ?Medications - No data to display ? ?ED Course/ Medical Decision Making/ A&P ?  ?                        ?  Medical Decision Making ?Amount and/or Complexity of Data Reviewed ?Labs: ordered. ? ? ?Gina Frost is here with abdominal pain.  History of eczema.  Patient about 26 to [redacted] weeks pregnant.  No complications of pregnancy.  Having some upper abdominal discomfort.  Mostly resolved.  Took some Tums.  Patient overall appears well.  Bedside ultrasound shows good fetal movement, fetal heart rate within normal limits.  Tocometry and fetal heart rate checked and no signs of contractions or concerning features of these.  No real focal abdominal pain on exam.  CBC and CMP and urinalysis were obtained.  Per my review and interpretation of these labs there is no significant anemia, electrolyte  abnormality, kidney injury.  Gallbladder and liver enzymes within normal limits.  Vital signs are normal.  Blood pressure good.  No concern for eclampsia or other acute emergent OB process.  No concern for pancreatitis or other acute intra-abdominal process.  Suspect that this is likely musculoskeletal related.  Given reassurance and discharged from ED in good condition. ? ?This chart was dictated using voice recognition software.  Despite best efforts to proofread,  errors can occur which can change the documentation meaning.  ? ? ? ? ? ? ? ?Final Clinical Impression(s) / ED Diagnoses ?Final diagnoses:  ?Abdominal pain, unspecified abdominal location  ? ? ?Rx / DC Orders ?ED Discharge Orders   ? ? None  ? ?  ? ? ?  ?Virgina Norfolk, DO ?11/04/21 2706 ? ?

## 2021-11-04 NOTE — Progress Notes (Signed)
1015- I spoke with Gina Frost, the pt has been discharged. She says her labs were fine. I asked her to please call the [facility] RN before discharging a pt  so that I can review the FHR tracing before she leaves. FHR is reactive. Dr. Elgie Congo notified. ?

## 2021-11-04 NOTE — ED Triage Notes (Addendum)
Pt reports she began to have epigastric pain last night. Thought it was indigestion and tried tums, but pain continued this am. Denies pain in other areas or n/v/d. Pt is also [redacted] weeks pregnant. This is first pregnancy. Pt being seen by Dr. Gust Rung (? Spelling) for pregnancy. Denies contraction. ?

## 2021-11-04 NOTE — ED Notes (Signed)
ED Provider at bedside. 

## 2021-11-06 ENCOUNTER — Telehealth: Payer: Self-pay

## 2021-11-06 NOTE — Telephone Encounter (Signed)
Transition Care Management Follow-up Telephone Call ?Date of discharge and from where: 11/04/2021 [facility].  ?How have you been since you were released from the hospital? PT reports she is doing well , she has appointment with GYN in the next   ?Any questions or concerns? No ? ?Items Reviewed: ?Did the pt receive and understand the discharge instructions provided? Yes  ?Medications obtained and verified? Yes  ?Other? No  ?Any new allergies since your discharge? No  ?Dietary orders reviewed? Yes ?Do you have support at home? Yes  ? ?Home Care and Equipment/Supplies: ?Were home health services ordered? no ?If so, what is the name of the agency? N/a  ?Has the agency set up a time to come to the patient's home? no ?Were any new equipment or medical supplies ordered?  No ?What is the name of the medical supply agency? N/a ?Were you able to get the supplies/equipment? no ?Do you have any questions related to the use of the equipment or supplies? No ? ?Functional Questionnaire: (I = Independent and D = Dependent) ?ADLs: i ? ?Bathing/Dressing- i ? ?Meal Prep- i ? ?Eating- i ? ?Maintaining continence- i ? ?Transferring/Ambulation- i ? ?Managing Meds- i ? ?Follow up appointments reviewed: ? ?PCP Hospital f/u appt confirmed? No  Scheduled to see n/a on n/a @ n/a. ?Specialist Hospital f/u appt confirmed? Yes  Scheduled to see gyn on within a week @ n/a. ?Are transportation arrangements needed? No  ?If their condition worsens, is the pt aware to call PCP or go to the Emergency Dept.? Yes ?Was the patient provided with contact information for the PCP's office or ED? Yes ?Was to pt encouraged to call back with questions or concerns? Yes  ?

## 2021-11-15 ENCOUNTER — Encounter: Payer: Self-pay | Admitting: *Deleted

## 2021-11-15 ENCOUNTER — Other Ambulatory Visit: Payer: Self-pay | Admitting: *Deleted

## 2021-11-15 ENCOUNTER — Ambulatory Visit: Payer: 59 | Admitting: *Deleted

## 2021-11-15 ENCOUNTER — Ambulatory Visit: Payer: 59 | Attending: Obstetrics and Gynecology

## 2021-11-15 VITALS — BP 112/59 | HR 73

## 2021-11-15 DIAGNOSIS — D573 Sickle-cell trait: Secondary | ICD-10-CM | POA: Diagnosis not present

## 2021-11-15 DIAGNOSIS — Z6834 Body mass index (BMI) 34.0-34.9, adult: Secondary | ICD-10-CM

## 2021-11-15 DIAGNOSIS — E669 Obesity, unspecified: Secondary | ICD-10-CM

## 2021-11-15 DIAGNOSIS — O99213 Obesity complicating pregnancy, third trimester: Secondary | ICD-10-CM

## 2021-11-15 DIAGNOSIS — Z3A28 28 weeks gestation of pregnancy: Secondary | ICD-10-CM

## 2021-11-15 DIAGNOSIS — O285 Abnormal chromosomal and genetic finding on antenatal screening of mother: Secondary | ICD-10-CM | POA: Diagnosis not present

## 2021-11-15 DIAGNOSIS — O4440 Low lying placenta NOS or without hemorrhage, unspecified trimester: Secondary | ICD-10-CM | POA: Diagnosis not present

## 2021-11-15 DIAGNOSIS — Z3689 Encounter for other specified antenatal screening: Secondary | ICD-10-CM | POA: Diagnosis not present

## 2021-11-15 DIAGNOSIS — Z6841 Body Mass Index (BMI) 40.0 and over, adult: Secondary | ICD-10-CM

## 2021-11-17 ENCOUNTER — Ambulatory Visit (INDEPENDENT_AMBULATORY_CARE_PROVIDER_SITE_OTHER): Payer: 59 | Admitting: Family Medicine

## 2021-11-17 VITALS — BP 97/52 | HR 95 | Wt 228.0 lb

## 2021-11-17 DIAGNOSIS — Z23 Encounter for immunization: Secondary | ICD-10-CM

## 2021-11-17 DIAGNOSIS — Z3A28 28 weeks gestation of pregnancy: Secondary | ICD-10-CM

## 2021-11-17 DIAGNOSIS — Z3400 Encounter for supervision of normal first pregnancy, unspecified trimester: Secondary | ICD-10-CM

## 2021-11-17 DIAGNOSIS — D573 Sickle-cell trait: Secondary | ICD-10-CM

## 2021-11-17 NOTE — Progress Notes (Signed)
? ?  PRENATAL VISIT NOTE ? ?Subjective:  ?Gina Frost is a 31 y.o. G1P0000 at [redacted]w[redacted]d being seen today for ongoing prenatal care.  She is currently monitored for the following issues for this low-risk pregnancy and has Hirsutism; Intrinsic atopic dermatitis; Supervision of normal first pregnancy, antepartum; and Sickle-cell trait ([facility]) on their problem list. ? ?Patient reports no complaints.  Contractions: Not present. Vag. Bleeding: None.  Movement: Present. Denies leaking of fluid.  ? ?The following portions of the patient's history were reviewed and updated as appropriate: allergies, current medications, past family history, past medical history, past social history, past surgical history and problem list.  ? ?Objective:  ? ?Vitals:  ? 11/17/21 0814  ?BP: (!) 97/52  ?Pulse: 95  ?Weight: 228 lb (103.4 kg)  ? ? ?Fetal Status: Fetal Heart Rate (bpm): 145 Fundal Height: 28 cm Movement: Present    ? ?General:  Alert, oriented and cooperative. Patient is in no acute distress.  ?Skin: Skin is warm and dry. No rash noted.   ?Cardiovascular: Normal heart rate noted  ?Respiratory: Normal respiratory effort, no problems with respiration noted  ?Abdomen: Soft, gravid, appropriate for gestational age.  Pain/Pressure: Absent     ?Pelvic: Cervical exam deferred        ?Extremities: Normal range of motion.  Edema: None  ?Mental Status: Normal mood and affect. Normal behavior. Normal judgment and thought content.  ? ?Assessment and Plan:  ?Pregnancy: G1P0000 at [redacted]w[redacted]d ?1. [redacted] weeks gestation of pregnancy ?- Glucose Tolerance, 2 Hours w/1 Hour ?- RPR ?- HIV antibody (with reflex) ?- CBC ? ?2. Supervision of normal first pregnancy, antepartum ?FHT and FH normal ? ?3. Sickle-cell trait ([facility]) ? ? ?Preterm labor symptoms and general obstetric precautions including but not limited to vaginal bleeding, contractions, leaking of fluid and fetal movement were reviewed in detail with the patient. ?Please refer to After Visit Summary for  other counseling recommendations.  ? ?No follow-ups on file. ? ?Future Appointments  ?Date Time Provider Department Center  ?11/30/2021  1:30 PM Levie Heritage, DO [facility] None  ?12/14/2021 10:15 AM Adam Phenix, MD [facility] None  ?12/19/2021 12:45 PM [facility] NURSE [facility]  ?12/19/2021  1:00 PM [facility] US1 [facility]  ?12/28/2021 10:15 AM Levie Heritage, DO [facility] None  ?01/11/2022 10:15 AM Levie Heritage, DO [facility] None  ? ? ?Levie Heritage, DO ?

## 2021-11-18 LAB — CBC
Hematocrit: 34.2 % (ref 34.0–46.6)
Hemoglobin: 11.7 g/dL (ref 11.1–15.9)
MCH: 27.9 pg (ref 26.6–33.0)
MCHC: 34.2 g/dL (ref 31.5–35.7)
MCV: 81 fL (ref 79–97)
Platelets: 194 10*3/uL (ref 150–450)
RBC: 4.2 x10E6/uL (ref 3.77–5.28)
RDW: 13.9 % (ref 11.7–15.4)
WBC: 9.2 10*3/uL (ref 3.4–10.8)

## 2021-11-18 LAB — GLUCOSE TOLERANCE, 2 HOURS W/ 1HR
Glucose, 1 hour: 118 mg/dL (ref 70–179)
Glucose, 2 hour: 95 mg/dL (ref 70–152)
Glucose, Fasting: 88 mg/dL (ref 70–91)

## 2021-11-18 LAB — HIV ANTIBODY (ROUTINE TESTING W REFLEX): HIV Screen 4th Generation wRfx: NONREACTIVE

## 2021-11-18 LAB — RPR: RPR Ser Ql: NONREACTIVE

## 2021-11-20 ENCOUNTER — Encounter: Payer: Self-pay | Admitting: Family Medicine

## 2021-11-30 ENCOUNTER — Ambulatory Visit (INDEPENDENT_AMBULATORY_CARE_PROVIDER_SITE_OTHER): Payer: 59 | Admitting: Family Medicine

## 2021-11-30 VITALS — BP 100/64 | HR 78 | Wt 230.0 lb

## 2021-11-30 DIAGNOSIS — Z3400 Encounter for supervision of normal first pregnancy, unspecified trimester: Secondary | ICD-10-CM

## 2021-11-30 DIAGNOSIS — D573 Sickle-cell trait: Secondary | ICD-10-CM

## 2021-11-30 DIAGNOSIS — Z3A30 30 weeks gestation of pregnancy: Secondary | ICD-10-CM

## 2021-11-30 NOTE — Progress Notes (Signed)
   PRENATAL VISIT NOTE  Subjective:  Gina Frost is a 31 y.o. G1P0000 at [redacted]w[redacted]d being seen today for ongoing prenatal care.  She is currently monitored for the following issues for this low-risk pregnancy and has Hirsutism; Intrinsic atopic dermatitis; Supervision of normal first pregnancy, antepartum; and Sickle-cell trait ([facility]) on their problem list.  Patient reports backache.  Contractions: Not present. Vag. Bleeding: None.  Movement: Present. Denies leaking of fluid.   The following portions of the patient's history were reviewed and updated as appropriate: allergies, current medications, past family history, past medical history, past social history, past surgical history and problem list.   Objective:   Vitals:   11/30/21 1349  BP: 100/64  Pulse: 78  Weight: 230 lb (104.3 kg)    Fetal Status: Fetal Heart Rate (bpm): 150   Movement: Present     General:  Alert, oriented and cooperative. Patient is in no acute distress.  Skin: Skin is warm and dry. No rash noted.   Cardiovascular: Normal heart rate noted  Respiratory: Normal respiratory effort, no problems with respiration noted  Abdomen: Soft, gravid, appropriate for gestational age.  Pain/Pressure: Present     Pelvic: Cervical exam deferred        Extremities: Normal range of motion.  Edema: None  Mental Status: Normal mood and affect. Normal behavior. Normal judgment and thought content.   Assessment and Plan:  Pregnancy: G1P0000 at [redacted]w[redacted]d 1. [redacted] weeks gestation of pregnancy  2. Supervision of normal first pregnancy, antepartum FHT and FH normal. Pregnancy support band recommended  3. Sickle-cell trait (HCC)   Preterm labor symptoms and general obstetric precautions including but not limited to vaginal bleeding, contractions, leaking of fluid and fetal movement were reviewed in detail with the patient. Please refer to After Visit Summary for other counseling recommendations.   No follow-ups on file.  Future  Appointments  Date Time Provider Department Center  12/14/2021 10:15 AM Adam Phenix, MD [facility] None  12/19/2021 12:45 PM [facility] NURSE [facility]  12/19/2021  1:00 PM [facility] US1 [facility]  12/28/2021 10:15 AM Levie Heritage, DO [facility] None  01/11/2022 10:15 AM Levie Heritage, DO [facility] None    Levie Heritage, DO

## 2021-12-14 ENCOUNTER — Ambulatory Visit (INDEPENDENT_AMBULATORY_CARE_PROVIDER_SITE_OTHER): Payer: 59 | Admitting: Obstetrics & Gynecology

## 2021-12-14 VITALS — BP 114/59 | HR 96 | Wt 233.0 lb

## 2021-12-14 DIAGNOSIS — Z3A32 32 weeks gestation of pregnancy: Secondary | ICD-10-CM

## 2021-12-14 DIAGNOSIS — Z3400 Encounter for supervision of normal first pregnancy, unspecified trimester: Secondary | ICD-10-CM

## 2021-12-14 NOTE — Progress Notes (Signed)
   PRENATAL VISIT NOTE  Subjective:  Gina Frost is a 31 y.o. G1P0000 at 100w4d being seen today for ongoing prenatal care.  She is currently monitored for the following issues for this high-risk pregnancy and has Hirsutism; Intrinsic atopic dermatitis; Supervision of normal first pregnancy, antepartum; and Sickle-cell trait ([place]) on their problem list.  Patient reports occasional contractions.  Contractions: Irritability. Vag. Bleeding: None.  Movement: Present. Denies leaking of fluid.   The following portions of the patient's history were reviewed and updated as appropriate: allergies, current medications, past family history, past medical history, past social history, past surgical history and problem list.   Objective:   Vitals:   12/14/21 1004  BP: (!) 114/59  Pulse: 96  Weight: 233 lb (105.7 kg)    Fetal Status: Fetal Heart Rate (bpm): 140   Movement: Present     General:  Alert, oriented and cooperative. Patient is in no acute distress.  Skin: Skin is warm and dry. No rash noted.   Cardiovascular: Normal heart rate noted  Respiratory: Normal respiratory effort, no problems with respiration noted  Abdomen: Soft, gravid, appropriate for gestational age.  Pain/Pressure: Absent     Pelvic: Cervical exam deferred        Extremities: Normal range of motion.  Edema: None  Mental Status: Normal mood and affect. Normal behavior. Normal judgment and thought content.   Assessment and Plan:  Pregnancy: G1P0000 at [redacted]w[redacted]d 1. [redacted] weeks gestation of pregnancy Routine care  2. Supervision of normal first pregnancy, antepartum F/u US next week  Preterm labor symptoms and general obstetric precautions including but not limited to vaginal bleeding, contractions, leaking of fluid and fetal movement were reviewed in detail with the patient. Please refer to After Visit Summary for other counseling recommendations.   Return in about 2 weeks (around 12/28/2021).  Future Appointments  Date  Time Provider [place]  12/19/2021 12:45 PM [facility] NURSE [facility]  12/19/2021  1:00 PM [facility] US1 [facility]  12/28/2021 10:15 AM Truett Mainland, DO [facility] None  01/11/2022 10:15 AM Nehemiah Settle, Tanna Savoy, DO [facility] None    Emeterio Reeve, MD

## 2021-12-19 ENCOUNTER — Ambulatory Visit: Payer: 59 | Admitting: *Deleted

## 2021-12-19 ENCOUNTER — Ambulatory Visit: Payer: 59 | Attending: Obstetrics

## 2021-12-19 VITALS — BP 102/60 | HR 101

## 2021-12-19 DIAGNOSIS — O4440 Low lying placenta NOS or without hemorrhage, unspecified trimester: Secondary | ICD-10-CM

## 2021-12-19 DIAGNOSIS — O99213 Obesity complicating pregnancy, third trimester: Secondary | ICD-10-CM

## 2021-12-19 DIAGNOSIS — Z148 Genetic carrier of other disease: Secondary | ICD-10-CM | POA: Diagnosis not present

## 2021-12-19 DIAGNOSIS — Z6841 Body Mass Index (BMI) 40.0 and over, adult: Secondary | ICD-10-CM | POA: Insufficient documentation

## 2021-12-19 DIAGNOSIS — E669 Obesity, unspecified: Secondary | ICD-10-CM | POA: Diagnosis not present

## 2021-12-19 DIAGNOSIS — D573 Sickle-cell trait: Secondary | ICD-10-CM | POA: Diagnosis not present

## 2021-12-19 DIAGNOSIS — Z3A33 33 weeks gestation of pregnancy: Secondary | ICD-10-CM

## 2021-12-19 DIAGNOSIS — O99013 Anemia complicating pregnancy, third trimester: Secondary | ICD-10-CM

## 2021-12-28 ENCOUNTER — Ambulatory Visit (INDEPENDENT_AMBULATORY_CARE_PROVIDER_SITE_OTHER): Payer: 59 | Admitting: Family Medicine

## 2021-12-28 VITALS — BP 124/57 | HR 87 | Wt 236.0 lb

## 2021-12-28 DIAGNOSIS — Z3A34 34 weeks gestation of pregnancy: Secondary | ICD-10-CM

## 2021-12-28 DIAGNOSIS — Z3400 Encounter for supervision of normal first pregnancy, unspecified trimester: Secondary | ICD-10-CM

## 2021-12-28 DIAGNOSIS — D573 Sickle-cell trait: Secondary | ICD-10-CM

## 2021-12-28 NOTE — Progress Notes (Signed)
   PRENATAL VISIT NOTE  Subjective:  Gina Frost is a 31 y.o. G1P0000 at [redacted]w[redacted]d being seen today for ongoing prenatal care.  She is currently monitored for the following issues for this low-risk pregnancy and has Hirsutism; Intrinsic atopic dermatitis; Supervision of normal first pregnancy, antepartum; and Sickle-cell trait (HCC) on their problem list.  Patient reports no complaints.  Contractions: Not present. Vag. Bleeding: None.  Movement: Present. Denies leaking of fluid.   The following portions of the patient's history were reviewed and updated as appropriate: allergies, current medications, past family history, past medical history, past social history, past surgical history and problem list.   Objective:   Vitals:   12/28/21 1016  BP: (!) 124/57  Pulse: 87  Weight: 236 lb (107 kg)    Fetal Status: Fetal Heart Rate (bpm): 145   Movement: Present     General:  Alert, oriented and cooperative. Patient is in no acute distress.  Skin: Skin is warm and dry. No rash noted.   Cardiovascular: Normal heart rate noted  Respiratory: Normal respiratory effort, no problems with respiration noted  Abdomen: Soft, gravid, appropriate for gestational age.  Pain/Pressure: Absent     Pelvic: Cervical exam deferred        Extremities: Normal range of motion.  Edema: None  Mental Status: Normal mood and affect. Normal behavior. Normal judgment and thought content.   Assessment and Plan:  Pregnancy: G1P0000 at [redacted]w[redacted]d 1. [redacted] weeks gestation of pregnancy   2. Supervision of normal first pregnancy, antepartum FHT and FH normal  3. Sickle-cell trait (HCC)  Preterm labor symptoms and general obstetric precautions including but not limited to vaginal bleeding, contractions, leaking of fluid and fetal movement were reviewed in detail with the patient. Please refer to After Visit Summary for other counseling recommendations.   No follow-ups on file.  Future Appointments  Date Time Provider  Department Center  01/11/2022 10:15 AM Levie Heritage, DO [facility] None  01/18/2022  9:15 AM Levie Heritage, DO [facility] None  01/25/2022  1:50 PM Anyanwu, Jethro Bastos, MD [facility] None  02/01/2022  9:55 AM Anyanwu, Jethro Bastos, MD [facility] None    Levie Heritage, DO

## 2022-01-11 ENCOUNTER — Other Ambulatory Visit (HOSPITAL_COMMUNITY)
Admission: RE | Admit: 2022-01-11 | Discharge: 2022-01-11 | Disposition: A | Discharge status: Home / Self Care | Payer: 59 | Source: Ambulatory Visit | Attending: Family Medicine | Admitting: Family Medicine

## 2022-01-11 ENCOUNTER — Ambulatory Visit (INDEPENDENT_AMBULATORY_CARE_PROVIDER_SITE_OTHER): Payer: 59 | Admitting: Family Medicine

## 2022-01-11 VITALS — BP 108/59 | HR 89 | Wt 245.0 lb

## 2022-01-11 DIAGNOSIS — D573 Sickle-cell trait: Secondary | ICD-10-CM

## 2022-01-11 DIAGNOSIS — Z3400 Encounter for supervision of normal first pregnancy, unspecified trimester: Secondary | ICD-10-CM | POA: Diagnosis present

## 2022-01-11 NOTE — Progress Notes (Signed)
ROB 36.4 wks GBS and GC/CC today

## 2022-01-11 NOTE — Progress Notes (Signed)
   PRENATAL VISIT NOTE  Subjective:  Gina Frost is a 31 y.o. G1P0000 at [redacted]w[redacted]d being seen today for ongoing prenatal care.  She is currently monitored for the following issues for this low-risk pregnancy and has Hirsutism; Intrinsic atopic dermatitis; Supervision of normal first pregnancy, antepartum; and Sickle-cell trait (HCC) on their problem list.  Patient reports no complaints.  Contractions: Irritability. Vag. Bleeding: None.  Movement: Present. Denies leaking of fluid.   The following portions of the patient's history were reviewed and updated as appropriate: allergies, current medications, past family history, past medical history, past social history, past surgical history and problem list.   Objective:   Vitals:   01/11/22 1030  BP: (!) 108/59  Pulse: 89  Weight: 245 lb (111.1 kg)    Fetal Status: Fetal Heart Rate (bpm): 162   Movement: Present     General:  Alert, oriented and cooperative. Patient is in no acute distress.  Skin: Skin is warm and dry. No rash noted.   Cardiovascular: Normal heart rate noted  Respiratory: Normal respiratory effort, no problems with respiration noted  Abdomen: Soft, gravid, appropriate for gestational age.  Pain/Pressure: Absent     Pelvic: Cervical exam deferred        Extremities: Normal range of motion.  Edema: None  Mental Status: Normal mood and affect. Normal behavior. Normal judgment and thought content.   Assessment and Plan:  Pregnancy: G1P0000 at [redacted]w[redacted]d 1. Supervision of normal first pregnancy, antepartum FHT and FH normal - Strep Gp B NAA - Cervicovaginal ancillary only( [place])  2. Sickle-cell trait (HCC)  Preterm labor symptoms and general obstetric precautions including but not limited to vaginal bleeding, contractions, leaking of fluid and fetal movement were reviewed in detail with the patient. Please refer to After Visit Summary for other counseling recommendations.   No follow-ups on file.  Future  Appointments  Date Time Provider Department Center  01/18/2022  9:15 AM Levie Heritage, DO [facility] None  01/25/2022  1:50 PM Levie Heritage, DO [facility] None  02/01/2022  9:55 AM Anyanwu, Jethro Bastos, MD [facility] None    Levie Heritage, DO

## 2022-01-12 LAB — CERVICOVAGINAL ANCILLARY ONLY
Chlamydia: NEGATIVE
Comment: NEGATIVE
Comment: NORMAL
Neisseria Gonorrhea: NEGATIVE

## 2022-01-13 LAB — STREP GP B NAA: Strep Gp B NAA: POSITIVE — AB

## 2022-01-17 ENCOUNTER — Encounter: Payer: Self-pay | Admitting: Family Medicine

## 2022-01-17 DIAGNOSIS — O99820 Streptococcus B carrier state complicating pregnancy: Secondary | ICD-10-CM | POA: Insufficient documentation

## 2022-01-18 ENCOUNTER — Ambulatory Visit (INDEPENDENT_AMBULATORY_CARE_PROVIDER_SITE_OTHER): Payer: 59 | Admitting: Family Medicine

## 2022-01-18 VITALS — BP 111/64 | HR 112 | Wt 244.0 lb

## 2022-01-18 DIAGNOSIS — D573 Sickle-cell trait: Secondary | ICD-10-CM

## 2022-01-18 DIAGNOSIS — Z3400 Encounter for supervision of normal first pregnancy, unspecified trimester: Secondary | ICD-10-CM

## 2022-01-18 DIAGNOSIS — O99820 Streptococcus B carrier state complicating pregnancy: Secondary | ICD-10-CM

## 2022-01-18 NOTE — Progress Notes (Signed)
   PRENATAL VISIT NOTE  Subjective:  Gina Frost is a 31 y.o. G1P0000 at [redacted]w[redacted]d being seen today for ongoing prenatal care.  She is currently monitored for the following issues for this low-risk pregnancy and has Hirsutism; Intrinsic atopic dermatitis; Supervision of normal first pregnancy, antepartum; Sickle-cell trait (HCC); and GBS (group B Streptococcus carrier), +RV culture, currently pregnant on their problem list.  Patient reports occasional contractions.  Contractions: Irritability. Vag. Bleeding: None.  Movement: Present. Denies leaking of fluid.   The following portions of the patient's history were reviewed and updated as appropriate: allergies, current medications, past family history, past medical history, past social history, past surgical history and problem list.   Objective:   Vitals:   01/18/22 0910  BP: 111/64  Pulse: (!) 112  Weight: 244 lb (110.7 kg)    Fetal Status: Fetal Heart Rate (bpm): 150 Fundal Height: 38 cm Movement: Present  Presentation: Vertex  General:  Alert, oriented and cooperative. Patient is in no acute distress.  Skin: Skin is warm and dry. No rash noted.   Cardiovascular: Normal heart rate noted  Respiratory: Normal respiratory effort, no problems with respiration noted  Abdomen: Soft, gravid, appropriate for gestational age.  Pain/Pressure: Absent     Pelvic: Cervical exam performed in the presence of a chaperone Dilation: Closed Effacement (%): Thick Station: -3  Extremities: Normal range of motion.  Edema: Trace  Mental Status: Normal mood and affect. Normal behavior. Normal judgment and thought content.   Assessment and Plan:  Pregnancy: G1P0000 at [redacted]w[redacted]d  1. Supervision of normal first pregnancy, antepartum FHT and FH normal  2. GBS (group B Streptococcus carrier), +RV culture, currently pregnant Intrapartum Ppx  3. Sickle-cell trait (HCC) FOB neg   Term labor symptoms and general obstetric precautions including but not  limited to vaginal bleeding, contractions, leaking of fluid and fetal movement were reviewed in detail with the patient. Please refer to After Visit Summary for other counseling recommendations.   No follow-ups on file.  Future Appointments  Date Time Provider Department Center  01/25/2022  1:50 PM Levie Heritage, DO [facility] None  02/01/2022  9:55 AM Anyanwu, Jethro Bastos, MD [facility] None    Levie Heritage, DO

## 2022-01-25 ENCOUNTER — Ambulatory Visit (INDEPENDENT_AMBULATORY_CARE_PROVIDER_SITE_OTHER): Payer: 59 | Admitting: Family Medicine

## 2022-01-25 VITALS — BP 129/55 | HR 97 | Wt 246.0 lb

## 2022-01-25 DIAGNOSIS — D573 Sickle-cell trait: Secondary | ICD-10-CM

## 2022-01-25 DIAGNOSIS — O99820 Streptococcus B carrier state complicating pregnancy: Secondary | ICD-10-CM

## 2022-01-25 DIAGNOSIS — Z3A38 38 weeks gestation of pregnancy: Secondary | ICD-10-CM

## 2022-01-25 DIAGNOSIS — Z3400 Encounter for supervision of normal first pregnancy, unspecified trimester: Secondary | ICD-10-CM

## 2022-01-25 DIAGNOSIS — Z3403 Encounter for supervision of normal first pregnancy, third trimester: Secondary | ICD-10-CM

## 2022-01-25 NOTE — Progress Notes (Signed)
   PRENATAL VISIT NOTE  Subjective:  Gina Frost is a 31 y.o. G1P0000 at [redacted]w[redacted]d being seen today for ongoing prenatal care.  She is currently monitored for the following issues for this low-risk pregnancy and has Hirsutism; Intrinsic atopic dermatitis; Supervision of normal first pregnancy, antepartum; Sickle-cell trait (HCC); and GBS (group B Streptococcus carrier), +RV culture, currently pregnant on their problem list.  Patient reports occasional contractions.  Contractions: Irritability. Vag. Bleeding: None.  Movement: Present. Denies leaking of fluid.   The following portions of the patient's history were reviewed and updated as appropriate: allergies, current medications, past family history, past medical history, past social history, past surgical history and problem list.   Objective:   Vitals:   01/25/22 1357  BP: (!) 129/55  Pulse: 97  Weight: 246 lb (111.6 kg)    Fetal Status: Fetal Heart Rate (bpm): 140   Movement: Present     General:  Alert, oriented and cooperative. Patient is in no acute distress.  Skin: Skin is warm and dry. No rash noted.   Cardiovascular: Normal heart rate noted  Respiratory: Normal respiratory effort, no problems with respiration noted  Abdomen: Soft, gravid, appropriate for gestational age.  Pain/Pressure: Present     Pelvic: Cervical exam deferred        Extremities: Normal range of motion.  Edema: None  Mental Status: Normal mood and affect. Normal behavior. Normal judgment and thought content.   Assessment and Plan:  Pregnancy: G1P0000 at [redacted]w[redacted]d 1. [redacted] weeks gestation of pregnancy  2. Supervision of normal first pregnancy, antepartum FHT and FH normal Will schedule BPP at 40 weeks.  3. GBS (group B Streptococcus carrier), +RV culture, currently pregnant Intrapartum PPx.  4. Sickle-cell trait (HCC)   Term labor symptoms and general obstetric precautions including but not limited to vaginal bleeding, contractions, leaking of fluid  and fetal movement were reviewed in detail with the patient. Please refer to After Visit Summary for other counseling recommendations.   No follow-ups on file.  Future Appointments  Date Time Provider Department Center  02/01/2022 10:15 AM Anyanwu, Jethro Bastos, MD [facility] None    Levie Heritage, DO

## 2022-01-31 ENCOUNTER — Encounter (HOSPITAL_COMMUNITY): Payer: Self-pay | Admitting: Obstetrics and Gynecology

## 2022-01-31 ENCOUNTER — Inpatient Hospital Stay (HOSPITAL_COMMUNITY)
Admission: AD | Admit: 2022-01-31 | Discharge: 2022-02-04 | DRG: 788 | Disposition: A | Discharge status: Home / Self Care | Payer: 59 | Attending: Obstetrics and Gynecology | Admitting: Obstetrics and Gynecology

## 2022-01-31 DIAGNOSIS — O99820 Streptococcus B carrier state complicating pregnancy: Secondary | ICD-10-CM

## 2022-01-31 DIAGNOSIS — D573 Sickle-cell trait: Secondary | ICD-10-CM | POA: Diagnosis present

## 2022-01-31 DIAGNOSIS — O9902 Anemia complicating childbirth: Secondary | ICD-10-CM | POA: Diagnosis present

## 2022-01-31 DIAGNOSIS — O99214 Obesity complicating childbirth: Secondary | ICD-10-CM | POA: Diagnosis present

## 2022-01-31 DIAGNOSIS — Z3A39 39 weeks gestation of pregnancy: Secondary | ICD-10-CM

## 2022-01-31 DIAGNOSIS — O4292 Full-term premature rupture of membranes, unspecified as to length of time between rupture and onset of labor: Principal | ICD-10-CM

## 2022-01-31 DIAGNOSIS — E669 Obesity, unspecified: Secondary | ICD-10-CM | POA: Diagnosis not present

## 2022-01-31 DIAGNOSIS — O99824 Streptococcus B carrier state complicating childbirth: Secondary | ICD-10-CM | POA: Diagnosis present

## 2022-01-31 DIAGNOSIS — D649 Anemia, unspecified: Secondary | ICD-10-CM | POA: Diagnosis not present

## 2022-01-31 LAB — RPR: RPR Ser Ql: NONREACTIVE

## 2022-01-31 LAB — TYPE AND SCREEN
ABO/RH(D): O POS
Antibody Screen: NEGATIVE

## 2022-01-31 LAB — CBC
HCT: 34.6 % — ABNORMAL LOW (ref 36.0–46.0)
Hemoglobin: 11.9 g/dL — ABNORMAL LOW (ref 12.0–15.0)
MCH: 27.5 pg (ref 26.0–34.0)
MCHC: 34.4 g/dL (ref 30.0–36.0)
MCV: 79.9 fL — ABNORMAL LOW (ref 80.0–100.0)
Platelets: 177 10*3/uL (ref 150–400)
RBC: 4.33 MIL/uL (ref 3.87–5.11)
RDW: 15.5 % (ref 11.5–15.5)
WBC: 8.8 10*3/uL (ref 4.0–10.5)
nRBC: 0.2 % (ref 0.0–0.2)

## 2022-01-31 LAB — POCT FERN TEST: POCT Fern Test: POSITIVE

## 2022-01-31 MED ORDER — ONDANSETRON HCL 4 MG/2ML IJ SOLN: 4.0000 mg | Freq: Four times a day (QID) | INTRAMUSCULAR | Status: DC | PRN

## 2022-01-31 MED ORDER — SOD CITRATE-CITRIC ACID 500-334 MG/5ML PO SOLN
30.0000 mL | ORAL | Status: DC | PRN
  Administered 2022-02-01: 30 mL via ORAL
  Filled 2022-01-31: qty 30

## 2022-01-31 MED ORDER — OXYCODONE-ACETAMINOPHEN 5-325 MG PO TABS: 2.0000 | ORAL_TABLET | ORAL | Status: DC | PRN

## 2022-01-31 MED ORDER — PENICILLIN G POT IN DEXTROSE 60000 UNIT/ML IV SOLN
3.0000 10*6.[IU] | INTRAVENOUS | Status: DC
  Administered 2022-01-31 – 2022-02-01 (×8): 3 10*6.[IU] via INTRAVENOUS
  Filled 2022-01-31 (×8): qty 50

## 2022-01-31 MED ORDER — OXYCODONE-ACETAMINOPHEN 5-325 MG PO TABS: 1.0000 | ORAL_TABLET | ORAL | Status: DC | PRN

## 2022-01-31 MED ORDER — TERBUTALINE SULFATE 1 MG/ML IJ SOLN: 0.2500 mg | Freq: Once | INTRAMUSCULAR | Status: DC | PRN

## 2022-01-31 MED ORDER — MISOPROSTOL 50MCG HALF TABLET
50.0000 ug | ORAL_TABLET | ORAL | Status: DC | PRN
  Administered 2022-01-31: 50 ug via BUCCAL
  Filled 2022-01-31: qty 1

## 2022-01-31 MED ORDER — OXYTOCIN-SODIUM CHLORIDE 30-0.9 UT/500ML-% IV SOLN
2.5000 [IU]/h | INTRAVENOUS | Status: DC
  Administered 2022-02-02: 30 [IU] via INTRAVENOUS

## 2022-01-31 MED ORDER — LIDOCAINE HCL (PF) 1 % IJ SOLN: 30.0000 mL | INTRAMUSCULAR | Status: DC | PRN

## 2022-01-31 MED ORDER — SODIUM CHLORIDE 0.9 % IV SOLN
12.5000 mg | Freq: Once | INTRAVENOUS | Status: AC
  Administered 2022-01-31: 12.5 mg via INTRAVENOUS
  Filled 2022-01-31: qty 0.5

## 2022-01-31 MED ORDER — LACTATED RINGERS IV SOLN: 500.0000 mL | INTRAVENOUS | Status: DC | PRN

## 2022-01-31 MED ORDER — LACTATED RINGERS IV SOLN: INTRAVENOUS | Status: DC

## 2022-01-31 MED ORDER — SODIUM CHLORIDE 0.9 % IV SOLN
5.0000 10*6.[IU] | Freq: Once | INTRAVENOUS | Status: AC
  Administered 2022-01-31: 5 10*6.[IU] via INTRAVENOUS
  Filled 2022-01-31: qty 5

## 2022-01-31 MED ORDER — ACETAMINOPHEN 325 MG PO TABS: 650.0000 mg | ORAL_TABLET | ORAL | Status: DC | PRN

## 2022-01-31 MED ORDER — OXYTOCIN BOLUS FROM INFUSION: 333.0000 mL | Freq: Once | INTRAVENOUS | Status: DC

## 2022-01-31 MED ORDER — NALBUPHINE HCL 10 MG/ML IJ SOLN
10.0000 mg | INTRAMUSCULAR | Status: DC | PRN
  Administered 2022-01-31 – 2022-02-01 (×2): 10 mg via INTRAVENOUS
  Filled 2022-01-31 (×2): qty 1

## 2022-01-31 MED ORDER — FENTANYL CITRATE (PF) 100 MCG/2ML IJ SOLN
50.0000 ug | INTRAMUSCULAR | Status: DC | PRN
  Administered 2022-01-31 (×3): 100 ug via INTRAVENOUS
  Filled 2022-01-31 (×3): qty 2

## 2022-01-31 MED ORDER — BUTORPHANOL TARTRATE 2 MG/ML IJ SOLN: 2.0000 mg | INTRAMUSCULAR | Status: DC | PRN

## 2022-01-31 NOTE — Progress Notes (Signed)
Labor Progress Note ADY HEIMANN is a 31 y.o. G1P0000 at [redacted]w[redacted]d presented for SROM S: Doing well, desires a nap at this time. FOB is also her personal doula in training. She denies any pain related to CTX at this time, does have some discomfort with cervical exam; desires IV pain medication PRN. No acute concerns.  O:  BP 127/78   Pulse 75   Temp 98.8 F (37.1 C) (Oral)   Resp 17   Ht 5\' 5"  (1.651 m)   Wt 111.1 kg   LMP 04/30/2021   SpO2 100%   BMI 40.77 kg/m  EFM: 140 bpm/moderate variability/+accels, no decels  CVE: Dilation: 1.5 Effacement (%): 50 Cervical Position: Posterior Station: -3 Presentation: Vertex Exam by:: Dr [email or web address]   A&P: 31 y.o. G1P0000 [redacted]w[redacted]d SROM #Labor: Doing well, now s/p cooks FB. Anticipate vaginal delivery #Pain: PRN nitrous and IV fentanyl #FWB: Cat 1 #GBS positive > PCN  Birth plan: hands/knees or squatting during delivery, 2-minute cord clamping, other specifications pending   [redacted]w[redacted]d, MD 10:08 AM

## 2022-01-31 NOTE — MAU Note (Signed)
Pt does not want to receive IV fluids at this time. Pt is saline locked.

## 2022-01-31 NOTE — Progress Notes (Signed)
Labor Progress Note CHANDELL ATTRIDGE is a 31 y.o. G1P0000 at [redacted]w[redacted]d presented for PROM  S: Very uncomfortable with level of pain, reporting significant back pain with her CTXs. Desires a pain medication more long-lasting than IV fentanyl. Also having some nausea.   O:  BP 123/74   Pulse 68   Temp 98 F (36.7 C) (Oral)   Resp 16   Ht 5\' 5"  (1.651 m)   Wt 111.1 kg   LMP 04/30/2021   SpO2 100%   BMI 40.77 kg/m  EFM: 135 bpm/moderate variability/+accels, no decels  CVE: Dilation: 2 Effacement (%): 50 Cervical Position: Posterior Station: -3 Presentation: Vertex Exam by:: Dr. [email or web address]   A&P: 31 y.o. G1P0000 [redacted]w[redacted]d PROM #Labor: Slow change with FB in place, encouraged RN to continue to apply traction q2hrs. S/p cytotec at ~6 PM. Continue to monitor.  #Pain: PRN IV pain medication; start IV stadol and phenergan #FWB: Cat 1 #GBS positive> PCN  [redacted]w[redacted]d, MD 7:22 PM

## 2022-01-31 NOTE — MAU Note (Signed)
.  Gina Frost is a 31 y.o. at [redacted]w[redacted]d here in MAU reporting leaking fld since 0400. Went stood up after urinating some fld ran down her legs. Has continued to leak small amt of fld. Good FM. No recent sve. Denies VB  Onset of complaint: 0400 Pain score: 0 Vitals:   01/31/22 0527 01/31/22 0528  BP:  123/79  Pulse: 80   Temp: 97.9 F (36.6 C)   SpO2: 100%      FHT:140 Lab orders placed from triage:  mau labor eval

## 2022-01-31 NOTE — Progress Notes (Signed)
Patient is having back labor and would like additional interventions to help with labor progress. Discussed risks/benefits of cytotec. Reviewed buccal cytotec. Patient accepted.   BSUS performed to confirm vertex.   Encouraged position changes and to consider kneeling supported with pelvic tilts.   Federico Flake, MD, MPH, ABFM, [facility] Attending Physician Center for [facility]

## 2022-01-31 NOTE — H&P (Signed)
Gina Frost is a 31 y.o. female presenting for Premature Rupture of Membranes.  States this occurred at 0400.  Fluid was clear.  .States is not feeling any contractions right now.  Would like to proceed expectantly at this time  Plans Nitrous Oxide for pain control later.   Pregnancy has been followed at [facility] and remarkable for: Patient Active Problem List   Diagnosis Date Noted   GBS (group B Streptococcus carrier), +RV culture, currently pregnant 01/17/2022   Sickle-cell trait (HCC) 08/23/2021   Supervision of normal first pregnancy, antepartum 07/27/2021   Intrinsic atopic dermatitis 04/25/2020   Hirsutism 05/27/2019   RN Note: Gina Frost is a 31 y.o. at [redacted]w[redacted]d here in MAU reporting leaking fld since 0400. Went stood up after urinating some fld ran down her legs. Has continued to leak small amt of fld. Good FM. No recent sve. Denies VB  OB History     Gravida  1   Para  0   Term  0   Preterm  0   AB  0   Living  0      SAB  0   IAB  0   Ectopic  0   Multiple  0   Live Births  0          Past Medical History:  Diagnosis Date   Eczema    Eczema    Past Surgical History:  Procedure Laterality Date   TONSILLECTOMY AND ADENOIDECTOMY     Family History: family history includes Diabetes in her maternal grandmother; Eczema in her sister; Hyperlipidemia in her father; Hypertension in her mother; Kidney cancer in her mother; Lupus in her sister; Stroke in her mother. Social History:  reports that she has never smoked. She has never used smokeless tobacco. She reports that she does not currently use alcohol after a past usage of about 2.0 - 4.0 standard drinks of alcohol per week. She reports that she does not use drugs.     Maternal Diabetes: No Genetic Screening: Normal Maternal Ultrasounds/Referrals: Normal Fetal Ultrasounds or other Referrals:  None Maternal Substance Abuse:  No Significant Maternal Medications:  None Significant  Maternal Lab Results:  Group B Strep positive Other Comments:  None  Review of Systems  Constitutional:  Negative for chills and fever.  Eyes:  Negative for visual disturbance.  Respiratory:  Negative for shortness of breath.   Gastrointestinal:  Negative for abdominal pain, nausea and vomiting.  Genitourinary:  Positive for vaginal discharge (clear fluid). Negative for vaginal bleeding.   Maternal Medical History:  Reason for admission: Rupture of membranes.  Nausea.  Contractions: Onset was 3-5 hours ago.   Frequency: irregular.   Perceived severity is mild.   Fetal activity: Perceived fetal activity is normal.   Last perceived fetal movement was within the past hour.   Prenatal complications: No PIH or placental abnormality (originally had low lying placenta which resolved).   Prenatal Complications - Diabetes: none.     Blood pressure 124/67, pulse 72, temperature 97.9 F (36.6 C), height 5\' 5"  (1.651 m), weight 111.1 kg, last menstrual period 04/30/2021, SpO2 100 %. Maternal Exam:  Uterine Assessment: Contraction strength is mild.  Contraction frequency is irregular.  Abdomen: Patient reports no abdominal tenderness. Fetal presentation: vertex   Fetal Exam Fetal Monitor Review: Mode: ultrasound.   Baseline rate: 140.  Variability: moderate (6-25 bpm).   Pattern: accelerations present and no decelerations.   Fetal State Assessment: Category I - tracings  are normal.   Physical Exam Constitutional:      General: She is not in acute distress.    Appearance: She is not ill-appearing or toxic-appearing.  HENT:     Head: Normocephalic.  Cardiovascular:     Rate and Rhythm: Normal rate.  Pulmonary:     Effort: Pulmonary effort is normal.  Abdominal:     General: There is no distension.     Tenderness: There is no abdominal tenderness.  Genitourinary:    Comments: Vertex by ultrasound  Musculoskeletal:        General: Normal range of motion.     Cervical back:  Normal range of motion.  Skin:    General: Skin is warm and dry.  Neurological:     General: No focal deficit present.     Mental Status: She is alert.  Psychiatric:        Mood and Affect: Mood normal.     Prenatal labs: ABO, Rh: --/--/PENDING (07/19 0703) Antibody: PENDING (07/19 0703) Rubella: 5.60 (01/12 1431) RPR: Non Reactive (05/05 0816)  HBsAg: Negative (01/12 1431)  HIV: Non Reactive (05/05 0816)  GBS: Positive/-- (06/29 1054)   Assessment/Plan: Single IUP at [redacted]w[redacted]d PROM at term Latent labor GBS positive  Admit to Labor and Delivery Routine orders Patient prefers expectant management for now Plans nitrous oxide for pain management later Penicillin prophylaxis Anticipate SVD   Wynelle Bourgeois 01/31/2022, 7:37 AM

## 2022-01-31 NOTE — MAU Note (Signed)
D/w Wynelle Bourgeois CNM, okay for patient to eat light labor diet.

## 2022-01-31 NOTE — Progress Notes (Signed)
Patient ID: Gina Frost, female   DOB: Aug 09, 1990, 31 y.o.   MRN: [id number] Foley bulb came out  Vitals:   01/31/22 1629 01/31/22 1755 01/31/22 2011 01/31/22 2012  BP: 125/80 123/74  127/69  Pulse: 65 68  (!) 105  Resp:    18  Temp:   97.9 F (36.6 C)   TempSrc:   Oral   SpO2:      Weight:      Height:       FHR reassuring UCs q68min  Dilation: 4 Effacement (%): 60 Cervical Position: Middle Station: -3 Presentation: Vertex Exam by:: Wynelle Bourgeois, CNM  Discussed options. Wishes to proceed expectantly for now I am hesitant to use more Cytotec due to possible hyperstimulation

## 2022-01-31 NOTE — Progress Notes (Signed)
Pt informed that the ultrasound is considered a limited OB ultrasound and is not intended to be a complete ultrasound exam.  Patient also informed that the ultrasound is not being completed with the intent of assessing for fetal or placental anomalies or any pelvic abnormalities.  Explained that the purpose of today's ultrasound is to assess for  presentation.  Patient acknowledges the purpose of the exam and the limitations of the study.    Cephalic  Nestor Wieneke, NP   

## 2022-02-01 ENCOUNTER — Encounter: Payer: 59 | Admitting: Obstetrics & Gynecology

## 2022-02-01 ENCOUNTER — Inpatient Hospital Stay (HOSPITAL_COMMUNITY): Payer: 59 | Admitting: Anesthesiology

## 2022-02-01 ENCOUNTER — Encounter (HOSPITAL_COMMUNITY)
Admission: AD | Disposition: A | Discharge status: Home / Self Care | Payer: Self-pay | Source: Home / Self Care | Attending: Obstetrics and Gynecology

## 2022-02-01 DIAGNOSIS — O99214 Obesity complicating childbirth: Secondary | ICD-10-CM

## 2022-02-01 DIAGNOSIS — D649 Anemia, unspecified: Secondary | ICD-10-CM

## 2022-02-01 DIAGNOSIS — E669 Obesity, unspecified: Secondary | ICD-10-CM

## 2022-02-01 DIAGNOSIS — O9902 Anemia complicating childbirth: Secondary | ICD-10-CM

## 2022-02-01 DIAGNOSIS — Z3A39 39 weeks gestation of pregnancy: Secondary | ICD-10-CM

## 2022-02-01 SURGERY — Surgical Case
Anesthesia: Epidural

## 2022-02-01 MED ORDER — LIDOCAINE HCL (PF) 1 % IJ SOLN
INTRAMUSCULAR | Status: DC | PRN
  Administered 2022-02-01: 3 mL via EPIDURAL
  Administered 2022-02-01: 2 mL via EPIDURAL
  Administered 2022-02-01: 5 mL via EPIDURAL

## 2022-02-01 MED ORDER — EPHEDRINE 5 MG/ML INJ: 10.0000 mg | INTRAVENOUS | Status: DC | PRN

## 2022-02-01 MED ORDER — FENTANYL-BUPIVACAINE-NACL 0.5-0.125-0.9 MG/250ML-% EP SOLN
12.0000 mL/h | EPIDURAL | Status: DC | PRN
  Administered 2022-02-01: 12 mL/h via EPIDURAL
  Filled 2022-02-01: qty 250

## 2022-02-01 MED ORDER — LIDOCAINE-EPINEPHRINE (PF) 2 %-1:200000 IJ SOLN
INTRAMUSCULAR | Status: DC | PRN
  Administered 2022-02-01: 7 mL via EPIDURAL
  Administered 2022-02-01: 3 mL via EPIDURAL

## 2022-02-01 MED ORDER — LACTATED RINGERS IV SOLN: 500.0000 mL | Freq: Once | INTRAVENOUS | Status: DC

## 2022-02-01 MED ORDER — OXYTOCIN-SODIUM CHLORIDE 30-0.9 UT/500ML-% IV SOLN
1.0000 m[IU]/min | INTRAVENOUS | Status: DC
  Administered 2022-02-01: 2 m[IU]/min via INTRAVENOUS
  Filled 2022-02-01: qty 500

## 2022-02-01 MED ORDER — PHENYLEPHRINE 80 MCG/ML (10ML) SYRINGE FOR IV PUSH (FOR BLOOD PRESSURE SUPPORT): 80.0000 ug | PREFILLED_SYRINGE | INTRAVENOUS | Status: DC | PRN

## 2022-02-01 MED ORDER — LIDOCAINE-EPINEPHRINE (PF) 2 %-1:200000 IJ SOLN
INTRAMUSCULAR | Status: AC
  Filled 2022-02-01: qty 40

## 2022-02-01 MED ORDER — TERBUTALINE SULFATE 1 MG/ML IJ SOLN: 0.2500 mg | Freq: Once | INTRAMUSCULAR | Status: DC | PRN

## 2022-02-01 MED ORDER — DIPHENHYDRAMINE HCL 50 MG/ML IJ SOLN: 12.5000 mg | INTRAMUSCULAR | Status: DC | PRN

## 2022-02-01 MED ORDER — CEFAZOLIN SODIUM-DEXTROSE 2-3 GM-%(50ML) IV SOLR
INTRAVENOUS | Status: DC | PRN
  Administered 2022-02-01: 2 g via INTRAVENOUS

## 2022-02-01 SURGICAL SUPPLY — 27 items
CHLORAPREP W/TINT 26ML (MISCELLANEOUS) ×4 IMPLANT
CLAMP CORD UMBIL (MISCELLANEOUS) ×2 IMPLANT
DRSG OPSITE POSTOP 4X10 (GAUZE/BANDAGES/DRESSINGS) ×3 IMPLANT
ELECT REM PT RETURN 9FT ADLT (ELECTROSURGICAL) ×2
ELECTRODE REM PT RTRN 9FT ADLT (ELECTROSURGICAL) ×1 IMPLANT
EXTRACTOR VACUUM M CUP 4 TUBE (SUCTIONS) IMPLANT
GAUZE SPONGE 4X4 12PLY STRL LF (GAUZE/BANDAGES/DRESSINGS) ×2 IMPLANT
GLOVE BIOGEL PI IND STRL 6.5 (GLOVE) ×1 IMPLANT
GLOVE BIOGEL PI IND STRL 7.0 (GLOVE) ×1 IMPLANT
GLOVE BIOGEL PI INDICATOR 6.5 (GLOVE) ×1
GLOVE BIOGEL PI INDICATOR 7.0 (GLOVE) ×1
GLOVE SURG SS PI 6.5 STRL IVOR (GLOVE) ×2 IMPLANT
GOWN STRL REUS W/TWL LRG LVL3 (GOWN DISPOSABLE) ×4 IMPLANT
KIT ABG SYR 3ML LUER SLIP (SYRINGE) IMPLANT
NDL HYPO 25X5/8 SAFETYGLIDE (NEEDLE) IMPLANT
NEEDLE HYPO 25X5/8 SAFETYGLIDE (NEEDLE) IMPLANT
NS IRRIG 1000ML POUR BTL (IV SOLUTION) ×2 IMPLANT
PACK C SECTION WH (CUSTOM PROCEDURE TRAY) ×2 IMPLANT
PAD ABD 7.5X8 STRL (GAUZE/BANDAGES/DRESSINGS) ×1 IMPLANT
PAD OB MATERNITY 4.3X12.25 (PERSONAL CARE ITEMS) ×2 IMPLANT
RTRCTR C-SECT PINK 25CM LRG (MISCELLANEOUS) IMPLANT
SUT PLAIN 0 NONE (SUTURE) IMPLANT
SUT VIC AB 0 CT1 36 (SUTURE) ×8 IMPLANT
SUT VIC AB 4-0 KS 27 (SUTURE) ×2 IMPLANT
TOWEL OR 17X24 6PK STRL BLUE (TOWEL DISPOSABLE) ×2 IMPLANT
TRAY FOLEY W/BAG SLVR 14FR LF (SET/KITS/TRAYS/PACK) ×2 IMPLANT
WATER STERILE IRR 1000ML POUR (IV SOLUTION) ×2 IMPLANT

## 2022-02-01 NOTE — Progress Notes (Signed)
Patient ID: Gina Frost, female   DOB: 1991-02-16, 31 y.o.   MRN: [id number] Has rested well  Vitals:   02/01/22 0015 02/01/22 0016 02/01/22 0216 02/01/22 0218  BP:  106/72  (!) 111/33  Pulse:  66  75  Resp:  16  14  Temp: 99.2 F (37.3 C)  98.6 F (37 C)   TempSrc: Oral  Oral   SpO2:      Weight:      Height:       FHR reassuring UCs spaced out  Dilation: 4 Effacement (%): 40 Cervical Position: Posterior Station: -3 Presentation: Vertex Exam by:: Wynelle Bourgeois, CNM  Discussed options, recommend Pitocin Augmentation Patient agrees to proceed

## 2022-02-01 NOTE — Anesthesia Preprocedure Evaluation (Addendum)
Anesthesia Evaluation  Patient identified by MRN, date of birth, ID band Patient awake    Reviewed: Allergy & Precautions, NPO status , Patient's Chart, lab work & pertinent test results  Airway Mallampati: II  TM Distance: >3 FB Neck ROM: Full    Dental  (+) Teeth Intact, Dental Advisory Given   Pulmonary neg pulmonary ROS,    Pulmonary exam normal breath sounds clear to auscultation       Cardiovascular negative cardio ROS Normal cardiovascular exam Rhythm:Regular Rate:Normal     Neuro/Psych negative neurological ROS     GI/Hepatic negative GI ROS, Neg liver ROS,   Endo/Other  Morbid obesity  Renal/GU negative Renal ROS     Musculoskeletal negative musculoskeletal ROS (+)   Abdominal (+) + obese,   Peds  Hematology  (+) Blood dyscrasia, anemia , Plt 177k   Anesthesia Other Findings Day of surgery medications reviewed with the patient.  Reproductive/Obstetrics (+) Pregnancy                            Anesthesia Physical Anesthesia Plan  ASA: 3  Anesthesia Plan: Epidural   Post-op Pain Management: Ofirmev IV (intra-op)* and Toradol IV (intra-op)*   Induction:   PONV Risk Score and Plan: 3 and Treatment may vary due to age or medical condition, Ondansetron, Dexamethasone and Scopolamine patch - Pre-op  Airway Management Planned: Natural Airway  Additional Equipment:   Intra-op Plan:   Post-operative Plan:   Informed Consent: I have reviewed the patients History and Physical, chart, labs and discussed the procedure including the risks, benefits and alternatives for the proposed anesthesia with the patient or authorized representative who has indicated his/her understanding and acceptance.     Dental advisory given  Plan Discussed with: CRNA  Anesthesia Plan Comments:        Anesthesia Quick Evaluation

## 2022-02-01 NOTE — Progress Notes (Signed)
Patient ID: Gina Frost, female   DOB: 1990/10/16, 31 y.o.   MRN: [id number] Patient requesting a cesarean section. Patient was being induced secondary to SROM since 0400. She received augmentation with pitocin and no cervical change over 8 hours. Pitocin was stopped for a few hours before restarting. Patient desires to proceed with cesarean delivery. Risks, benefits and alternatives were explained including but not limited to risks of bleeding, infection and damage to adjacent organs. Patient verbalized understanding and all questions were answered. Will plan for primary cesarean section due to failure to progress

## 2022-02-01 NOTE — Anesthesia Procedure Notes (Signed)
Epidural Patient location during procedure: OB Start time: 02/01/2022 5:07 AM End time: 02/01/2022 5:14 AM  Staffing Anesthesiologist: Collene Schlichter, MD Performed: anesthesiologist   Preanesthetic Checklist Completed: patient identified, IV checked, risks and benefits discussed, monitors and equipment checked, pre-op evaluation and timeout performed  Epidural Patient position: sitting Prep: DuraPrep Patient monitoring: blood pressure and continuous pulse ox Approach: midline Location: L3-L4 Injection technique: LOR air  Needle:  Needle type: Tuohy  Needle gauge: 17 G Needle length: 9 cm Needle insertion depth: 6 cm Catheter size: 19 Gauge Catheter at skin depth: 11 cm Test dose: negative and Other (1% Lidocaine)  Additional Notes Patient identified.  Risk benefits discussed including failed block, incomplete pain control, headache, nerve damage, paralysis, blood pressure changes, nausea, vomiting, reactions to medication both toxic or allergic, and postpartum back pain.  Patient expressed understanding and wished to proceed.  All questions were answered.  Sterile technique used throughout procedure and epidural site dressed with sterile barrier dressing. No paresthesia or other complications noted. The patient did not experience any signs of intravascular injection such as tinnitus or metallic taste in mouth nor signs of intrathecal spread such as rapid motor block. Please see nursing notes for vital signs. Reason for block:procedure for pain

## 2022-02-01 NOTE — Progress Notes (Addendum)
Patient ID: KHYLER URDA, female   DOB: [birth year], 29 y.o.   MRN: [id number]  Subjective: -  Objective: BP 120/69   Pulse 70   Temp 98.4 F (36.9 C) (Oral)   Resp 15   Ht 5\' 5"  (1.651 m)   Wt 111.1 kg   LMP 04/30/2021   SpO2 99%   BMI 40.77 kg/m  No intake/output data recorded. No intake/output data recorded.  Fetal Monitoring: FHT: 145 bpm, Mod Var, + Variable Decels, +Accels UC: palpates moderate, irritability graphed    Vaginal Exam: SVE:   Dilation: 5 Effacement (%): 60 Station: -3 Exam by:: [email or web address], CNM Membranes:SROM x 35 hrs Internal Monitors: IUPC, FSE placed  Augmentation/Induction: Pitocin:13mUn/min Cytotec: None  Assessment:  IUP at 39.4 weeks Cat II FT  Prolonged ROM Augmentation of Labor  Plan: -Discussed IUPC r/b, prior to insertion, including increased risk of infection and ability to adequately monitor quantity and strength of contractions. -Discussed r/b of fetal scalp electrode including infection, trauma to fetal scalp, and ability to monitor fetal heart rate more accurately.  Patient w/o q/c and agrees to placement.  -Internal monitors placed. -Continue pitocin titration. -May consider amnioinfusion. -Position change for suspected asynclitic presentation and for resolution of Cat II FT -Continue other mgmt as ordered   30m, CNM Advanced Practice Provider, Center for [facility] 02/01/2022, 3:25 PM

## 2022-02-01 NOTE — Progress Notes (Signed)
Patient ID: KESLEIGH MORSON, female   DOB: 1991/04/06, 31 y.o.   MRN: [id number]  Subjective: -Care assumed of 31 y.o. G1P0 at [redacted]w[redacted]d who presents for SROM on 7/19. Pregnancy and medical history significant for GBS positive.  In room to meet acquaintance of patient and family.  Patient resting in bed.  Reports comfort with epidural.    Objective: BP 133/67   Pulse 85   Temp 98.7 F (37.1 C) (Oral)   Resp 14   Ht 5\' 5"  (1.651 m)   Wt 111.1 kg   LMP 04/30/2021   SpO2 99%   BMI 40.77 kg/m  No intake/output data recorded. No intake/output data recorded.  Fetal Monitoring: FHT: 145 bpm, Mod Var, + Variable Decels, +Accels UC: Q2-64min    Vaginal Exam: SVE:   Dilation: 5 Effacement (%): 60 Station: -2 Exam by:: [email or web address], RN Membranes:SROM x 29 hrs Internal Monitors: None  Augmentation/Induction: Pitocin:78mUn/min Cytotec: None  Assessment:  IUP at 39.4 weeks Cat I FT  Prolonged ROM Augmentation of Labor  Plan: -Position change with resolution of variable decels. -Encouraged rest.  -Continue other mgmt as ordered   9m, CNM Advanced Practice Provider, Center for [facility] 02/01/2022, 8:51 AM

## 2022-02-02 ENCOUNTER — Encounter (HOSPITAL_COMMUNITY): Payer: Self-pay | Admitting: Obstetrics and Gynecology

## 2022-02-02 DIAGNOSIS — Z3A39 39 weeks gestation of pregnancy: Secondary | ICD-10-CM

## 2022-02-02 DIAGNOSIS — O99824 Streptococcus B carrier state complicating childbirth: Secondary | ICD-10-CM

## 2022-02-02 LAB — CBC
HCT: 31.4 % — ABNORMAL LOW (ref 36.0–46.0)
Hemoglobin: 10.7 g/dL — ABNORMAL LOW (ref 12.0–15.0)
MCH: 27.1 pg (ref 26.0–34.0)
MCHC: 34.1 g/dL (ref 30.0–36.0)
MCV: 79.5 fL — ABNORMAL LOW (ref 80.0–100.0)
Platelets: 161 10*3/uL (ref 150–400)
RBC: 3.95 MIL/uL (ref 3.87–5.11)
RDW: 15.6 % — ABNORMAL HIGH (ref 11.5–15.5)
WBC: 15.2 10*3/uL — ABNORMAL HIGH (ref 4.0–10.5)
nRBC: 0.1 % (ref 0.0–0.2)

## 2022-02-02 LAB — CREATININE, SERUM
Creatinine, Ser: 0.76 mg/dL (ref 0.44–1.00)
GFR, Estimated: >60 mL/min (ref >60)

## 2022-02-02 MED ORDER — HYDROMORPHONE HCL 1 MG/ML IJ SOLN
INTRAMUSCULAR | Status: AC
  Filled 2022-02-02: qty 0.5

## 2022-02-02 MED ORDER — MEPERIDINE HCL 25 MG/ML IJ SOLN: 6.2500 mg | INTRAMUSCULAR | Status: DC | PRN

## 2022-02-02 MED ORDER — TETANUS-DIPHTH-ACELL PERTUSSIS 5-2.5-18.5 LF-MCG/0.5 IM SUSY: 0.5000 mL | PREFILLED_SYRINGE | Freq: Once | INTRAMUSCULAR | Status: DC

## 2022-02-02 MED ORDER — NALOXONE HCL 0.4 MG/ML IJ SOLN: 0.4000 mg | INTRAMUSCULAR | Status: DC | PRN

## 2022-02-02 MED ORDER — SCOPOLAMINE 1 MG/3DAYS TD PT72: 1.0000 | MEDICATED_PATCH | Freq: Once | TRANSDERMAL | Status: DC

## 2022-02-02 MED ORDER — ENOXAPARIN SODIUM 60 MG/0.6ML IJ SOSY
60.0000 mg | PREFILLED_SYRINGE | INTRAMUSCULAR | Status: DC
  Administered 2022-02-02 – 2022-02-03 (×2): 60 mg via SUBCUTANEOUS
  Filled 2022-02-02 (×2): qty 0.6

## 2022-02-02 MED ORDER — IBUPROFEN 600 MG PO TABS
600.0000 mg | ORAL_TABLET | Freq: Four times a day (QID) | ORAL | Status: DC
  Administered 2022-02-02 – 2022-02-04 (×8): 600 mg via ORAL
  Filled 2022-02-02 (×10): qty 1

## 2022-02-02 MED ORDER — NALOXONE HCL 4 MG/10ML IJ SOLN: 1.0000 ug/kg/h | INTRAVENOUS | Status: DC | PRN

## 2022-02-02 MED ORDER — DIPHENHYDRAMINE HCL 25 MG PO CAPS: 25.0000 mg | ORAL_CAPSULE | Freq: Four times a day (QID) | ORAL | Status: DC | PRN

## 2022-02-02 MED ORDER — SODIUM CHLORIDE 0.9% FLUSH: 3.0000 mL | INTRAVENOUS | Status: DC | PRN

## 2022-02-02 MED ORDER — ONDANSETRON HCL 4 MG/2ML IJ SOLN
INTRAMUSCULAR | Status: AC
  Filled 2022-02-02: qty 2

## 2022-02-02 MED ORDER — SIMETHICONE 80 MG PO CHEW: 80.0000 mg | CHEWABLE_TABLET | ORAL | Status: DC | PRN

## 2022-02-02 MED ORDER — ENOXAPARIN SODIUM 40 MG/0.4ML IJ SOSY: 40.0000 mg | PREFILLED_SYRINGE | INTRAMUSCULAR | Status: DC

## 2022-02-02 MED ORDER — WITCH HAZEL-GLYCERIN EX PADS: 1.0000 | MEDICATED_PAD | CUTANEOUS | Status: DC | PRN

## 2022-02-02 MED ORDER — SODIUM CHLORIDE 0.9 % IV SOLN
500.0000 mg | INTRAVENOUS | Status: AC
  Administered 2022-02-02: 500 mg via INTRAVENOUS
  Filled 2022-02-02 (×2): qty 5

## 2022-02-02 MED ORDER — STERILE WATER FOR IRRIGATION IR SOLN
Status: DC | PRN
  Administered 2022-02-02: 1

## 2022-02-02 MED ORDER — PHENYLEPHRINE HCL-NACL 20-0.9 MG/250ML-% IV SOLN
INTRAVENOUS | Status: DC | PRN
  Administered 2022-02-02: 60 ug/min via INTRAVENOUS

## 2022-02-02 MED ORDER — DIBUCAINE (PERIANAL) 1 % EX OINT: 1.0000 | TOPICAL_OINTMENT | CUTANEOUS | Status: DC | PRN

## 2022-02-02 MED ORDER — ONDANSETRON HCL 4 MG/2ML IJ SOLN: 4.0000 mg | Freq: Three times a day (TID) | INTRAMUSCULAR | Status: DC | PRN

## 2022-02-02 MED ORDER — OXYTOCIN-SODIUM CHLORIDE 30-0.9 UT/500ML-% IV SOLN
2.5000 [IU]/h | INTRAVENOUS | Status: AC
  Administered 2022-02-02: 2.5 [IU]/h via INTRAVENOUS
  Filled 2022-02-02: qty 500

## 2022-02-02 MED ORDER — DIPHENHYDRAMINE HCL 50 MG/ML IJ SOLN: 12.5000 mg | INTRAMUSCULAR | Status: DC | PRN

## 2022-02-02 MED ORDER — ONDANSETRON HCL 4 MG/2ML IJ SOLN
INTRAMUSCULAR | Status: DC | PRN
  Administered 2022-02-02: 4 mg via INTRAVENOUS

## 2022-02-02 MED ORDER — DIPHENHYDRAMINE HCL 25 MG PO CAPS: 25.0000 mg | ORAL_CAPSULE | ORAL | Status: DC | PRN

## 2022-02-02 MED ORDER — PRENATAL MULTIVITAMIN CH
1.0000 | ORAL_TABLET | Freq: Every day | ORAL | Status: DC
Start: 2022-02-02 — End: 2022-02-04
  Administered 2022-02-02 – 2022-02-04 (×3): 1 via ORAL
  Filled 2022-02-02 (×3): qty 1

## 2022-02-02 MED ORDER — COCONUT OIL OIL: 1.0000 | TOPICAL_OIL | Status: DC | PRN

## 2022-02-02 MED ORDER — OXYCODONE-ACETAMINOPHEN 5-325 MG PO TABS
1.0000 | ORAL_TABLET | ORAL | Status: DC | PRN
  Administered 2022-02-02: 2 via ORAL
  Administered 2022-02-02: 1 via ORAL
  Filled 2022-02-02: qty 1
  Filled 2022-02-02: qty 2
  Filled 2022-02-02: qty 1

## 2022-02-02 MED ORDER — MENTHOL 3 MG MT LOZG: 1.0000 | LOZENGE | OROMUCOSAL | Status: DC | PRN

## 2022-02-02 MED ORDER — SENNOSIDES-DOCUSATE SODIUM 8.6-50 MG PO TABS
2.0000 | ORAL_TABLET | Freq: Every day | ORAL | Status: DC
  Administered 2022-02-03 – 2022-02-04 (×2): 2 via ORAL
  Filled 2022-02-02 (×2): qty 2

## 2022-02-02 MED ORDER — HYDROMORPHONE HCL 1 MG/ML IJ SOLN
0.2500 mg | INTRAMUSCULAR | Status: DC | PRN
  Administered 2022-02-02 (×2): 0.5 mg via INTRAVENOUS

## 2022-02-02 MED ORDER — PROMETHAZINE HCL 25 MG/ML IJ SOLN: 6.2500 mg | INTRAMUSCULAR | Status: DC | PRN

## 2022-02-02 MED ORDER — SIMETHICONE 80 MG PO CHEW
80.0000 mg | CHEWABLE_TABLET | Freq: Three times a day (TID) | ORAL | Status: DC
  Administered 2022-02-02 – 2022-02-04 (×7): 80 mg via ORAL
  Filled 2022-02-02 (×7): qty 1

## 2022-02-02 NOTE — Anesthesia Procedure Notes (Signed)
Date/Time: 02/02/2022 12:33 AM  Performed by: Laruth Bouchard., CRNAOxygen Delivery Method: Nasal cannula

## 2022-02-02 NOTE — Anesthesia Postprocedure Evaluation (Signed)
Anesthesia Post Note  Patient: Gina Frost  Procedure(s) Performed: CESAREAN SECTION     Patient location during evaluation: PACU Anesthesia Type: Epidural Level of consciousness: patient cooperative and awake and alert Pain management: pain level controlled Vital Signs Assessment: post-procedure vital signs reviewed and stable Respiratory status: spontaneous breathing Cardiovascular status: stable Anesthetic complications: no   No notable events documented.  Last Vitals:  Vitals:   02/02/22 0317 02/02/22 0417  BP: (!) 154/67 112/75  Pulse: 88 70  Resp: 16 16  Temp: 37.4 C 37.2 C  SpO2: 98%     Last Pain:  Vitals:   02/02/22 0418  TempSrc:   PainSc: 0-No pain   Pain Goal: Patients Stated Pain Goal: 0 (02/02/22 0217)                 [name]

## 2022-02-02 NOTE — Lactation Note (Signed)
This note was copied from a baby's chart. Lactation Consultation Note  Patient Name: Gina Frost SEGBT'D Date: 02/02/2022 Reason for consult: Initial assessment;1st time breastfeeding;Term (C/S delivery) Age:29 hours P1, term female infant. LC entered the room, infant was cuing to breastfeed. Mom latched infant on her left breast using the football hold position, infant latched with depth and BF for 10 minutes.  Mom will continue to breastfeed infant according to hunger cues, on demand, 8 to 12+ times within 24 hours, skin to skin. Mom knows if infant is still cuing after offering the first breast to offer the 2nd breast during the feeding. Mom knows to call RN/LC for further latch assistance if needed. Mom made aware of O/P services, breastfeeding support groups, community resources, and our phone # for post-discharge questions.   Maternal Data Does the patient have breastfeeding experience prior to this delivery?: No  Feeding Mother's Current Feeding Choice: Breast Milk  LATCH Score Latch: Grasps breast easily, tongue down, lips flanged, rhythmical sucking.  Audible Swallowing: Spontaneous and intermittent  Type of Nipple: Everted at rest and after stimulation  Comfort (Breast/Nipple): Soft / non-tender  Hold (Positioning): Assistance needed to correctly position infant at breast and maintain latch.  LATCH Score: 9   Lactation Tools Discussed/Used    Interventions Interventions: Breast feeding basics reviewed;Assisted with latch;Skin to skin;Breast compression;Adjust position;Support pillows;Position options;Expressed milk;Education;LC Services brochure  Discharge    Consult Status Consult Status: Follow-up Date: 02/02/22 Follow-up type: In-patient    Danelle Earthly 02/02/2022, 3:20 AM

## 2022-02-02 NOTE — Transfer of Care (Signed)
Immediate Anesthesia Transfer of Care Note  Patient: Gina Frost  Procedure(s) Performed: CESAREAN SECTION  Patient Location: PACU  Anesthesia Type:Epidural  Level of Consciousness: awake, alert  and oriented  Airway & Oxygen Therapy: Patient Spontanous Breathing  Post-op Assessment: Report given to RN and Post -op Vital signs reviewed and stable  Post vital signs: Reviewed and stable  Last Vitals:  Vitals Value Taken Time  BP 118/77 02/02/22 0105  Temp 36.6 C 02/02/22 0105  Pulse 95 02/02/22 0109  Resp 18 02/02/22 0109  SpO2 100 % 02/02/22 0109  Vitals shown include unvalidated device data.  Last Pain:  Vitals:   02/01/22 2221  TempSrc: Axillary  PainSc:          Complications: No notable events documented.

## 2022-02-02 NOTE — Progress Notes (Addendum)
POSTPARTUM PROGRESS NOTE  Subjective: ZYRAH WISWELL is a 31 y.o. G1P1001 s/p eLTCS at [redacted]w[redacted]d  She reports she doing well. No acute events overnight. She denies any problems with ambulating, voiding or po intake. Denies nausea or vomiting. She has  passed flatus. Pain is moderately controlled at a 5/10.  Lochia is appropriate, similar to a menstrual period for patient.   Objective: Blood pressure 112/75, pulse 70, temperature 98.9 F (37.2 C), temperature source Oral, resp. rate 16, height 5' 5"  (1.651 m), weight 111.1 kg, last menstrual period 04/30/2021, SpO2 98 %, currently breastfeeding.  Physical Exam:  General: alert, cooperative and no distress Chest: no respiratory distress Abdomen: soft, non-tender  Uterine Fundus: firm and at level of umbilicus Extremities: No calf swelling or tenderness  no edema  Recent Labs    01/31/22 0700 02/02/22 0520  HGB 11.9* 10.7*  HCT 34.6* 31.4*    Assessment/Plan: CASHLIN KREPSis a 31y.o. G1P1001 s/p eLTCS at 365w5dor failed IOL.   Routine Postpartum Care: Doing well, pain well-controlled.  -- Continue routine care, lactation support  -- Contraception: condoms -- Feeding: breast  Dispo: Plan for discharge Sunday  JaAnnia BeltMeCimarron Cityor WoBhc [facility] 02/02/2022 7:44 AM    I personally saw and evaluated the patient, performing the key elements of the service. I developed and verified the management plan that is described in the resident's/student's note, and I agree with the content with my edits above. VSS, HRR&R, Resp unlabored, Legs neg.  FrNigel BertholdCNM 02/02/2022 8:52 AM

## 2022-02-02 NOTE — Op Note (Addendum)
Gina Frost PROCEDURE DATE: 01/31/2022 - 02/02/2022  PREOPERATIVE DIAGNOSIS: Intrauterine pregnancy at  [redacted]w[redacted]d weeks gestation; failure to progress: arrest of dilation and patient declines vag del attempt  POSTOPERATIVE DIAGNOSIS: The same  PROCEDURE:     Cesarean Section  SURGEON:  Dr. Gigi Gin Orlondo [place]  ASSISTANT: Mariah Milling, MS-III  INDICATIONS: Gina Frost is a 31 y.o. G1P1001 at [redacted]w[redacted]d scheduled for cesarean section secondary to failure to progress: arrest of dilation and patient declines vag del attempt.  The risks of cesarean section discussed with the patient included but were not limited to: bleeding which may require transfusion or reoperation; infection which may require antibiotics; injury to bowel, bladder, ureters or other surrounding organs; injury to the fetus; need for additional procedures including hysterectomy in the event of a life-threatening hemorrhage; placental abnormalities wth subsequent pregnancies, incisional problems, thromboembolic phenomenon and other postoperative/anesthesia complications. The patient concurred with the proposed plan, giving informed written consent for the procedure.    FINDINGS:  Viable female infant in cephalic presentation.  Apgars 8 and 9, weight, 7 pounds and 3 ounces.  Clear amniotic fluid.  Intact placenta, three vessel cord.  Normal uterus, fallopian tubes and ovaries bilaterally.  ANESTHESIA:    Epidural INTRAVENOUS FLUIDS:1000 ml ESTIMATED BLOOD LOSS: 731 ml URINE OUTPUT:  200 ml SPECIMENS: Placenta sent to L&D and later home with patient COMPLICATIONS: None immediate  PROCEDURE IN DETAIL:  The patient received intravenous antibiotics and had sequential compression devices applied to her lower extremities while in the preoperative area.  She was then taken to the operating room where anesthesia was induced and was found to be adequate. A foley catheter was placed into her bladder and attached to [employer] gravity. She was then  placed in a dorsal supine position with a leftward tilt, and prepped and draped in a sterile manner. After an adequate timeout was performed, a Pfannenstiel skin incision was made with scalpel and carried through to the underlying layer of fascia. The fascia was incised in the midline and this incision was extended bilaterally using the Mayo scissors. Kocher clamps were applied to the superior aspect of the fascial incision and the underlying rectus muscles were dissected off bluntly. A similar process was carried out on the inferior aspect of the facial incision. The rectus muscles were separated in the midline bluntly and the peritoneum was entered bluntly. The Alexis self-retaining retractor was introduced into the abdominal cavity. Attention was turned to the lower uterine segment where a bladder flap was created, and a transverse hysterotomy was made with a scalpel and extended bilaterally bluntly. The infant was successfully delivered and delayed cord clamping was performed for 1 minute. The cord was clamped and cut and infant was handed over to awaiting neonatology team. Uterine massage was then administered and the placenta delivered intact with three-vessel cord. The uterus was cleared of clot and debris.  The hysterotomy was closed with 0 Vicryl in a running locked fashion, and an imbricating layer was also placed with a 0 Vicryl. Overall, excellent hemostasis was noted. The pelvis copiously irrigated and cleared of all clot and debris. Hemostasis was confirmed on all surfaces.  The peritoneum and the muscles were reapproximated using 0 vicryl interrupted stitches. The fascia was then closed using 0 Vicryl in a running fashion.  The subcutaneous layer was reapproximated with plain gut and the skin was closed in a subcuticular fashion using 3.0 Vicryl. The patient tolerated the procedure well. Sponge, lap, instrument and needle counts were correct x  2. She was taken to the recovery room in stable condition.     Janaye Corp ConstantMD  02/02/2022 12:47 AM

## 2022-02-02 NOTE — Discharge Summary (Signed)
Postpartum Discharge Summary    Patient Name: Gina Frost DOB: 1991-02-20 MRN: [id number]  Date of admission: 01/31/2022 Delivery date:02/02/2022  Delivering provider: CONSTANT, PEGGY  Date of discharge: 02/04/2022  Admitting diagnosis: Indication for care in labor and delivery, antepartum [O75.9] Intrauterine pregnancy: [redacted]w[redacted]d     Secondary diagnosis:  Principal Problem:   Indication for care in labor and delivery, antepartum  Additional problems: failure to progress    Discharge diagnosis: Term Pregnancy Delivered                                              Post partum procedures: None Augmentation: Pitocin Complications: None  Hospital course: Induction of Labor With Cesarean Section   31 y.o. yo G1P1001 at [redacted]w[redacted]d was admitted to the hospital 01/31/2022 for induction of labor. Patient had a labor course significant for failure to progress at 5.5 cm for 8 hours. Patient elected to proceed with cesarean section rather than attempt other interventions. Delivery details are as follows: Membrane Rupture Time/Date: 4:00 AM ,01/31/2022   Delivery Method:C-Section, Low Transverse  Details of operation can be found in separate operative Note.  Patient had an uncomplicated postpartum course. She is ambulating, tolerating a regular diet, passing flatus, and urinating well.  Patient is discharged home in stable condition on 02/04/22.      Newborn Data: Birth date:02/02/2022  Birth time:12:07 AM  Gender:Female  Living status:Living  Apgars:8 ,9  Weight:3250 g                                 Physical exam  Vitals:   02/03/22 0500 02/03/22 1400 02/03/22 2225 02/04/22 0545  BP: 126/78 121/77 127/72 110/69  Pulse: 76 77 90 75  Resp: 16 16 18 18   Temp: 98.5 F (36.9 C) 98.8 F (37.1 C) 97.8 F (36.6 C) 97.9 F (36.6 C)  TempSrc: Oral Oral Oral Oral  SpO2: 98%  100% 98%  Weight:      Height:       General: alert, cooperative, and no distress Lochia: appropriate Uterine Fundus:  firm Incision: Healing well with no significant drainage, No significant erythema, Dressing is clean, dry, and intact DVT Evaluation: No evidence of DVT seen on physical exam. Negative Homan's sign. No cords or calf tenderness. No significant calf/ankle edema. Labs: Lab Results  Component Value Date   WBC 15.2 (H) 02/02/2022   HGB 10.7 (L) 02/02/2022   HCT 31.4 (L) 02/02/2022   MCV 79.5 (L) 02/02/2022   PLT 161 02/02/2022      Latest Ref Rng & Units 02/02/2022    5:20 AM  CMP  Creatinine 0.44 - 1.00 mg/dL 7.84    Edinburgh Score:    02/03/2022    8:35 AM  Edinburgh Postnatal Depression Scale Screening Tool  I have been able to laugh and see the funny side of things. 0  I have looked forward with enjoyment to things. 0  I have blamed myself unnecessarily when things went wrong. 2  I have been anxious or worried for no good reason. 1  I have felt scared or panicky for no good reason. 0  Things have been getting on top of me. 1  I have been so unhappy that I have had difficulty sleeping. 0  I have felt sad or miserable. 0  I have been so unhappy that I have been crying. 0  The thought of harming myself has occurred to me. 0  Edinburgh Postnatal Depression Scale Total 4     After visit meds:  Allergies as of 02/04/2022       Reactions   Shellfish Allergy Anaphylaxis        Medication List     STOP taking these medications    EPINEPHrine 0.3 mg/0.3 mL Soaj injection Commonly known as: EPI-PEN       TAKE these medications    acetaminophen 500 MG tablet Commonly known as: TYLENOL Take 2 tablets (1,000 mg total) by mouth every 6 (six) hours as needed for mild pain.   ibuprofen 600 MG tablet Commonly known as: ADVIL Take 1 tablet (600 mg total) by mouth every 6 (six) hours as needed for fever, headache or mild pain.   oxyCODONE 5 MG immediate release tablet Commonly known as: Oxy IR/ROXICODONE Take 1-2 tablets (5-10 mg total) by mouth every 4 (four) hours  as needed for severe pain or breakthrough pain.   PRENATAL VITAMINS PO Take 1 tablet by mouth every morning.   senna-docusate 8.6-50 MG tablet Commonly known as: Senokot-S Take 2 tablets by mouth daily. Start taking on: February 05, 2022   triamcinolone ointment 0.1 % Commonly known as: KENALOG Apply 1 Application topically daily as needed (rash/itching).               Discharge Care Instructions  (From admission, onward)           Start     Ordered   02/04/22 0000  Discharge wound care:       Comments: As per discharge handout and nursing instructions   02/04/22 1108             Discharge home in stable condition Infant Feeding: Breast Infant Disposition:home with mother Discharge instruction: per After Visit Summary and Postpartum booklet. Activity: Advance as tolerated. Pelvic rest for 6 weeks.  Diet: routine diet Future Appointments: Future Appointments  Date Time Provider Department Center  02/08/2022  3:30 PM Reva Bores, MD [facility] None  03/15/2022  2:10 PM Adrian Blackwater Rhona Raider, DO [facility] None   Follow up Visit:   Please schedule this patient for a In person postpartum visit in 4 weeks with the following provider: Any provider. Additional Postpartum F/U:Incision check 1 week  Delivery mode:  C-Section, Low Transverse  Anticipated Birth Control:  Unsure   02/04/2022 Jaynie Collins, MD

## 2022-02-02 NOTE — Lactation Note (Signed)
This note was copied from a baby's chart. Lactation Consultation Note  Patient Name: Gina Frost [id number] Date: 02/02/2022 Reason for consult: Follow-up assessment;Primapara;1st time breastfeeding;Term Age:29 hours  P1 mother whose infant is now 21 hours old.  This is a term baby at 39+5 weeks.  Mother's current feeding preference is breast.  Mother requested latch assistance.  Arrived to find "JT" swaddled and asleep in the bassinet.  Asked father to change baby's diaper while I taught hand expression to mother.  With practice, mother was able to express colostrum drops which I finger fed to "JT."  Assisted to latch easily in the football hold.  With gentle stimulation he was able to breast feed for 12 minutes.  Mother denied pain with feeding.  Demonstrated breast compressions and showed father how to assist mother with breast feeding.  Father very willing to assist.  Encouraged mother to feed 8-12 times/24 hours or sooner if "JT" shows cues.  Suggested she call her RN/LC for latch assistance as needed.  RN updated.   Maternal Data Has patient been taught Hand Expression?: Yes Does the patient have breastfeeding experience prior to this delivery?: No  Feeding Mother's Current Feeding Choice: Breast Milk  LATCH Score Latch: Repeated attempts needed to sustain latch, nipple held in mouth throughout feeding, stimulation needed to elicit sucking reflex.  Audible Swallowing: None  Type of Nipple: Everted at rest and after stimulation  Comfort (Breast/Nipple): Soft / non-tender  Hold (Positioning): Assistance needed to correctly position infant at breast and maintain latch.  LATCH Score: 6   Lactation Tools Discussed/Used    Interventions Interventions: Breast feeding basics reviewed;Assisted with latch;Skin to skin;Breast massage;Hand express;Breast compression;Position options;Support pillows;Adjust position;Education  Discharge Pump: Personal (Medela)  Consult  Status Consult Status: Follow-up Date: 02/03/22 Follow-up type: In-patient    Adelina Collard R Jennings Corado 02/02/2022, 6:18 AM

## 2022-02-03 ENCOUNTER — Encounter (HOSPITAL_COMMUNITY): Payer: Self-pay | Admitting: Obstetrics and Gynecology

## 2022-02-03 MED ORDER — ACETAMINOPHEN 500 MG PO TABS
1000.0000 mg | ORAL_TABLET | Freq: Four times a day (QID) | ORAL | Status: DC
  Administered 2022-02-03 – 2022-02-04 (×5): 1000 mg via ORAL
  Filled 2022-02-03 (×5): qty 2

## 2022-02-03 MED ORDER — OXYCODONE HCL 5 MG PO TABS: 5.0000 mg | ORAL_TABLET | ORAL | Status: DC | PRN

## 2022-02-03 NOTE — Lactation Note (Signed)
This note was copied from a baby's chart. Lactation Consultation Note  Patient Name: Gina Frost Date: 02/03/2022 Reason for consult: Follow-up assessment Age:29 hours   LC Note:  Attempted to visit with mother, however, she was in the shower.  Father sleeping.  Family members present and holding "JT" who was swaddled and asleep.  Asked family members to have mother call when it is convenient for a return visit.     Maternal Data    Feeding    LATCH Score                    Lactation Tools Discussed/Used    Interventions    Discharge    Consult Status Consult Status: Follow-up Date: 02/04/22 Follow-up type: In-patient    Ritter Helsley R Ripley Lovecchio 02/03/2022, 1:47 PM

## 2022-02-03 NOTE — Progress Notes (Signed)
POSTPARTUM PROGRESS NOTE  POD #1  Subjective:  SHANDRA SZYMBORSKI is a 31 y.o. G1P1001 s/p eLTCS at [redacted]w[redacted]d.  She reports she doing well. No acute events overnight. She reports she is doing well. She denies any problems with ambulating, voiding or po intake. Denies nausea or vomiting. She has passed flatus. Pain is  controlled.  Lochia is minimal.  Objective: Blood pressure 126/78, pulse 76, temperature 98.5 F (36.9 C), temperature source Oral, resp. rate 16, height 5\' 5"  (1.651 m), weight 111.1 kg, last menstrual period 04/30/2021, SpO2 98 %, unknown if currently breastfeeding.  Physical Exam:  General: alert, cooperative and no distress Chest: no respiratory distress Heart:regular rate, distal pulses intact Abdomen: soft, nontender,  Uterine Fundus: firm, appropriately tender DVT Evaluation: No calf swelling or tenderness Extremities: No LE edema Skin: warm, dry; incision clean/dry/intact w/ honeycomb dressing in place  Recent Labs    02/02/22 0520  HGB 10.7*  HCT 31.4*    Assessment/Plan: [name] GIAIMO is a 31 y.o. G1P1001 s/p eLTCS at [redacted]w[redacted]d for failed IOL 2/2 PROM.  POD#1 - Doing welll; pain is controlled. H/H appropriate  Routine postpartum care  OOB, ambulated  Lovenox for VTE prophylaxis Anemia: asymptomatic Contraception: Condoms Feeding: Breast  Dispo: Plan for discharge 7/23.   LOS: 3 days   8/23, DO OB Fellow, Faculty [facility], Center for [facility] 02/03/2022, 9:48 AM

## 2022-02-03 NOTE — Lactation Note (Signed)
This note was copied from a baby's chart. Lactation Consultation Note  Patient Name: Gina Frost [id number] Date: 02/03/2022 Reason for consult: Follow-up assessment;Term;Infant weight loss;Breastfeeding assistance (5.08% WL) Age:29 hours  P1, Term, Infant Female, 5.08% WL  LC entered the room and mom had just finished breastfeeding baby. Per mom, he has been doing some cluster feeding. Mom states that baby stayed on the breast for 1-2 min, but fed for 20 min at 1507.   Mom denies any pain with the latch, but says that her nipples are sore due to cluster feeding. LC gave mom comfort gels and encouraged her to hand express and let her milk air dry onto her nipples as an alternative to coconut oil.   Mom is aware that the comfort gels are good for 24 hours.   Mom states that she has no further questions or concerns.    Lactation Tools Discussed/Used Tools: Comfort gels  Interventions Interventions: Comfort gels  Discharge    Consult Status Consult Status: Follow-up Date: 02/04/22 Follow-up type: In-patient    Delene Loll 02/03/2022, 4:44 PM

## 2022-02-04 MED ORDER — OXYCODONE HCL 5 MG PO TABS: 5.0000 mg | ORAL_TABLET | ORAL | 0 refills | Status: DC | PRN

## 2022-02-04 MED ORDER — ACETAMINOPHEN 500 MG PO TABS: 1000.0000 mg | ORAL_TABLET | Freq: Four times a day (QID) | ORAL | 2 refills | Status: DC | PRN

## 2022-02-04 MED ORDER — IBUPROFEN 600 MG PO TABS: 600.0000 mg | ORAL_TABLET | Freq: Four times a day (QID) | ORAL | 2 refills | Status: DC | PRN

## 2022-02-04 MED ORDER — SENNOSIDES-DOCUSATE SODIUM 8.6-50 MG PO TABS: 2.0000 | ORAL_TABLET | Freq: Every day | ORAL | 2 refills | Status: DC

## 2022-02-04 NOTE — Lactation Note (Signed)
This note was copied from a baby's chart. Lactation Consultation Note  Patient Name: Gina Frost [id number] Date: 02/04/2022   Age:29 hours  LC attempted to visit with mom, but MD was in the room. Will follow up later.  Maternal Data    Feeding    LATCH Score                    Lactation Tools Discussed/Used    Interventions    Discharge    Consult Status      Orvil Feil Lisl Slingerland 02/04/2022, 10:03 AM

## 2022-02-04 NOTE — Lactation Note (Signed)
This note was copied from a baby's chart. Lactation Consultation Note  Patient Name: Gina Frost [id number] Date: 02/04/2022 Reason for consult: Follow-up assessment;Infant weight loss;Primapara;1st time breastfeeding;Term;Breastfeeding assistance (8.31% WL) Age:29 hours  P1, Term, Infant Female, 8.31% WL  LC entered the room and baby had been taken for circumcision. Per mom, things are going well. Mom asked when her milk volume would increase. LC reviewed milk production in the first few days of life and spoke to the parents about supply and demand.   LC also discussed engorgement, warning signs, infant I/O, pumping, and outpatient LC services.   Mom states that she has no further questions or concerns.   Current Feeding Plan:  Breastfeed according to feeding cues 8+ times in 24 hours.  Hand express for stimulation and to entice baby to latch.  Watch infant I/O and call pediatrician with questions or concerns.  Call outpatient [facility] for breastfeeding assistance.      Interventions Interventions: Breast feeding basics reviewed;Education  Discharge Discharge Education: Engorgement and breast care;Warning signs for feeding baby;Outpatient recommendation  Consult Status Consult Status: Complete Date: 02/04/22 Follow-up type: In-patient    Gina Frost 02/04/2022, 11:15 AM

## 2022-02-05 ENCOUNTER — Other Ambulatory Visit: Payer: 59

## 2022-02-08 ENCOUNTER — Encounter: Payer: Self-pay | Admitting: Family Medicine

## 2022-02-08 ENCOUNTER — Ambulatory Visit (INDEPENDENT_AMBULATORY_CARE_PROVIDER_SITE_OTHER): Payer: 59 | Admitting: Family Medicine

## 2022-02-08 VITALS — BP 118/85 | HR 86 | Wt 228.0 lb

## 2022-02-08 DIAGNOSIS — Z09 Encounter for follow-up examination after completed treatment for conditions other than malignant neoplasm: Secondary | ICD-10-CM

## 2022-02-08 NOTE — Progress Notes (Signed)
   Subjective:    Patient ID: Gina Frost is a 31 y.o. female presenting with Wound Check  on 02/08/2022  HPI: Patient is 1 week s/p PLTCS due to arrest of dilation. Reports feeling well. Not needing opiate pain meds. Breast feeding is going well.  Review of Systems  Constitutional:  Negative for chills and fever.  Respiratory:  Negative for shortness of breath.   Cardiovascular:  Negative for chest pain.  Gastrointestinal:  Negative for abdominal pain, nausea and vomiting.  Genitourinary:  Negative for dysuria.  Skin:  Negative for rash.      Objective:    BP 118/85   Pulse 86   Wt 228 lb (103.4 kg)   Breastfeeding Yes   BMI 37.94 kg/m  Physical Exam Exam conducted with a chaperone present.  Constitutional:      General: She is not in acute distress.    Appearance: She is well-developed.  HENT:     Head: Normocephalic and atraumatic.  Eyes:     General: No scleral icterus. Cardiovascular:     Rate and Rhythm: Normal rate.  Pulmonary:     Effort: Pulmonary effort is normal.  Abdominal:     Palpations: Abdomen is soft.     Comments: Incision is well healed. Honeycomb removed.  Musculoskeletal:     Cervical back: Neck supple.  Skin:    General: Skin is warm and dry.  Neurological:     Mental Status: She is alert and oriented to person, place, and time.         Assessment & Plan:   Postop check - Doing well. Healing well.   Return in about 4 weeks (around 03/08/2022) for pp check.  Reva Bores, MD 02/08/2022 5:08 PM

## 2022-03-15 ENCOUNTER — Encounter: Payer: Self-pay | Admitting: Family Medicine

## 2022-03-15 ENCOUNTER — Ambulatory Visit (INDEPENDENT_AMBULATORY_CARE_PROVIDER_SITE_OTHER): Payer: 59 | Admitting: Family Medicine

## 2022-03-15 MED ORDER — LEVONORGESTREL 1.5 MG PO TABS: 1.5000 mg | ORAL_TABLET | Freq: Once | ORAL | 6 refills | Status: AC

## 2022-03-15 NOTE — Progress Notes (Signed)
Post Partum Visit Note  Gina Frost is a 31 y.o. G24P1001 female who presents for a postpartum visit. She is 6 weeks postpartum following a primary cesarean section.  I have fully reviewed the prenatal and intrapartum course. The delivery was at 39.5 gestational weeks.  Anesthesia: spinal. Postpartum course has been normal. Baby is doing well. Baby is feeding by breast. Bleeding no bleeding. Bowel function is normal. Bladder function is normal. Patient is sexually active. Contraception method is none. Postpartum depression screening: negative.   The pregnancy intention screening data noted above was reviewed. Potential methods of contraception were discussed. The patient elected to proceed with No data recorded.   Edinburgh Postnatal Depression Scale - 03/15/22 1414       Edinburgh Postnatal Depression Scale:  In the Past 7 Days   I have been able to laugh and see the funny side of things. 0    I have looked forward with enjoyment to things. 0    I have blamed myself unnecessarily when things went wrong. 2    I have been anxious or worried for no good reason. 1    I have felt scared or panicky for no good reason. 1    Things have been getting on top of me. 1    I have been so unhappy that I have had difficulty sleeping. 1    I have felt sad or miserable. 1    I have been so unhappy that I have been crying. 1    The thought of harming myself has occurred to me. 0    Edinburgh Postnatal Depression Scale Total 8             Health Maintenance Due  Topic Date Due   COVID-19 Vaccine (3 - Moderna series) 03/31/2020   INFLUENZA VACCINE  02/13/2022    The following portions of the patient's history were reviewed and updated as appropriate: allergies, current medications, past family history, past medical history, past social history, past surgical history, and problem list.  Review of Systems Pertinent items are noted in HPI.  Objective:  BP 95/60   Pulse 85   Wt 221 lb  (100.2 kg)   Breastfeeding Yes   BMI 36.78 kg/m    General:  alert, cooperative, and no distress   Breasts:  not indicated  Lungs: clear to auscultation bilaterally  Heart:  regular rate and rhythm, S1, S2 normal, no murmur, click, rub or gallop  Abdomen: soft, non-tender; bowel sounds normal; no masses,  no organomegaly   Wound well approximated incision  GU exam:  not indicated       Assessment:    1. Postpartum care and examination   Plan:   Essential components of care per ACOG recommendations:  1.  Mood and well being: Patient with negative depression screening today. Reviewed local resources for support.  - Patient tobacco use? No.   - hx of drug use? No.    2. Infant care and feeding:  -Patient currently breastmilk feeding? Yes. Discussed returning to work and pumping. Reviewed importance of draining breast regularly to support lactation.  -Social determinants of health (SDOH) reviewed in EPIC. No concerns  3. Sexuality, contraception and birth spacing - Patient does not want a pregnancy in the next year.   - Reviewed reproductive life planning. Reviewed contraceptive methods based on pt preferences and effectiveness.  Patient desired Female Condom today.   - Discussed birth spacing of 18 months  4. Sleep and  fatigue -Encouraged family/partner/community support of 4 hrs of uninterrupted sleep to help with mood and fatigue  5. Physical Recovery  - Discussed patients delivery and complications. She describes her labor as mixed. - Patient had a C-section failure to progress.  - Patient has urinary incontinence? No. - Patient is safe to resume physical and sexual activity  6.  Health Maintenance - HM due items addressed Yes - Last pap smear  Diagnosis  Date Value Ref Range Status  03/08/2021   Final   - Negative for intraepithelial lesion or malignancy (NILM)   Pap smear not done at today's visit.  -Breast Cancer screening indicated? No.   7. Chronic  Disease/Pregnancy Condition follow up: None  - PCP follow up  Levie Heritage, DO [facility], [facility]

## 2022-06-13 ENCOUNTER — Encounter: Payer: Self-pay | Admitting: Nurse Practitioner

## 2022-06-13 ENCOUNTER — Ambulatory Visit (INDEPENDENT_AMBULATORY_CARE_PROVIDER_SITE_OTHER): Payer: 59 | Admitting: Nurse Practitioner

## 2022-06-13 VITALS — BP 122/60 | HR 88 | Temp 98.4°F | Ht 65.0 in | Wt 219.0 lb

## 2022-06-13 DIAGNOSIS — R5383 Other fatigue: Secondary | ICD-10-CM | POA: Diagnosis not present

## 2022-06-13 DIAGNOSIS — E6609 Other obesity due to excess calories: Secondary | ICD-10-CM | POA: Diagnosis not present

## 2022-06-13 DIAGNOSIS — Z23 Encounter for immunization: Secondary | ICD-10-CM | POA: Diagnosis not present

## 2022-06-13 DIAGNOSIS — E66812 Obesity, class 2: Secondary | ICD-10-CM

## 2022-06-13 DIAGNOSIS — Z0000 Encounter for general adult medical examination without abnormal findings: Secondary | ICD-10-CM

## 2022-06-13 DIAGNOSIS — Z6836 Body mass index (BMI) 36.0-36.9, adult: Secondary | ICD-10-CM | POA: Diagnosis not present

## 2022-06-13 NOTE — Progress Notes (Signed)
 Subjective:     Patient ID: Gina Frost , female    DOB: 07/16/1990 , 31 y.o.   MRN: [id number]   Chief Complaint  Patient presents with   Annual Exam    HPI  Patient presents for HM. She had a baby in July - son. She is doing well. She is back at work and is fatigue.     Wt Readings from Last 3 Encounters: 06/13/22 : 219 lb (99.3 kg) 03/15/22 : 221 lb (100.2 kg) 02/08/22 : 228 lb (103.4 kg)    Past Medical History:  Diagnosis Date   Eczema    Eczema      Family History  Problem Relation Age of Onset   Stroke Mother    Hypertension Mother    Kidney cancer Mother    Hyperlipidemia Father    Lupus Sister    Eczema Sister    Diabetes Maternal Grandmother    Allergic rhinitis Neg Hx    Angioedema Neg Hx    Asthma Neg Hx    Atopy Neg Hx    Immunodeficiency Neg Hx    Urticaria Neg Hx      Current Outpatient Medications:    EPINEPHrine 0.3 mg/0.3 mL IJ SOAJ injection, Inject into the muscle., Disp: , Rfl:    EUCRISA 2 % OINT, , Disp: , Rfl:    Allergies  Allergen Reactions   Shellfish Allergy Anaphylaxis      The patient states she uses none for birth control.  Patient's last menstrual period was 05/09/2022 (approximate).  Negative for Dysmenorrhea and Negative for Menorrhagia. Negative for: breast discharge, breast lump(s), breast pain and breast self exam. Associated symptoms include abnormal vaginal bleeding. Pertinent negatives include abnormal bleeding (hematology), anxiety, decreased libido, depression, difficulty falling sleep, dyspareunia, history of infertility, nocturia, sexual dysfunction, sleep disturbances, urinary incontinence, urinary urgency, vaginal discharge and vaginal itching. Diet regular - she is trying to eat a balanced meal.  The patient states her exercise level is minimal. She is breast feeding.   The patient's tobacco use is:  Social History   Tobacco Use  Smoking Status Never  Smokeless Tobacco Never   She has been exposed  to passive smoke. The patient's alcohol use is:  Social History   Substance and Sexual Activity  Alcohol Use Not Currently   Alcohol/week: 2.0 - 4.0 standard drinks of alcohol   Types: 2 - 4 Standard drinks or equivalent per week   Comment: not while preg   Additional information: Last pap 02/2021, next one scheduled for 02/2024.    Review of Systems  Constitutional: Negative.   HENT: Negative.    Eyes: Negative.   Respiratory: Negative.    Cardiovascular: Negative.   Gastrointestinal: Negative.   Endocrine: Negative.   Genitourinary: Negative.   Musculoskeletal: Negative.   Skin: Negative.   Allergic/Immunologic: Negative.   Neurological: Negative.   Hematological: Negative.   Psychiatric/Behavioral: Negative.       Today's Vitals   06/13/22 0829  BP: 122/60  Pulse: 88  Temp: 98.4 F (36.9 C)  TempSrc: Oral  Weight: 219 lb (99.3 kg)  Height: 5' 5" (1.651 m)   Body mass index is 36.44 kg/m.  Wt Readings from Last 3 Encounters:  06/13/22 219 lb (99.3 kg)  03/15/22 221 lb (100.2 kg)  02/08/22 228 lb (103.4 kg)    Objective:  Physical Exam Vitals reviewed.  Constitutional:      General: She is not in acute distress.    Appearance:   Normal appearance. She is well-developed. She is obese.  HENT:     Head: Normocephalic and atraumatic.     Right Ear: Hearing, tympanic membrane, ear canal and external ear normal. There is no impacted cerumen.     Left Ear: Hearing, tympanic membrane, ear canal and external ear normal. There is no impacted cerumen.     Nose:     Comments: Deferred - masked    Mouth/Throat:     Comments: Deferred - masked Eyes:     General: Lids are normal.     Extraocular Movements: Extraocular movements intact.     Conjunctiva/sclera: Conjunctivae normal.     Pupils: Pupils are equal, round, and reactive to light.     Funduscopic exam:    Right eye: No papilledema.        Left eye: No papilledema.  Neck:     Thyroid: No thyroid mass.      Vascular: No carotid bruit.  Cardiovascular:     Rate and Rhythm: Normal rate and regular rhythm.     Pulses: Normal pulses.     Heart sounds: Normal heart sounds. No murmur heard. Pulmonary:     Effort: Pulmonary effort is normal. No respiratory distress.     Breath sounds: Normal breath sounds. No wheezing.  Chest:     Chest wall: No mass.  Breasts:    Tanner Score is 5.     Right: Normal. No mass or tenderness.     Left: Normal. No mass or tenderness.  Abdominal:     General: Abdomen is flat. Bowel sounds are normal. There is no distension.     Palpations: Abdomen is soft.     Tenderness: There is no abdominal tenderness.  Musculoskeletal:        General: No swelling. Normal range of motion.     Cervical back: Full passive range of motion without pain, normal range of motion and neck supple.     Right lower leg: No edema.     Left lower leg: No edema.  Lymphadenopathy:     Upper Body:     Right upper body: No supraclavicular, axillary or pectoral adenopathy.     Left upper body: No supraclavicular, axillary or pectoral adenopathy.  Skin:    General: Skin is warm and dry.     Capillary Refill: Capillary refill takes less than 2 seconds.  Neurological:     General: No focal deficit present.     Mental Status: She is alert and oriented to person, place, and time.     Cranial Nerves: No cranial nerve deficit.     Sensory: No sensory deficit.     Motor: No weakness.  Psychiatric:        Mood and Affect: Mood normal.        Behavior: Behavior normal.        Thought Content: Thought content normal.        Judgment: Judgment normal.         Assessment And Plan:     1. Encounter for annual physical exam Behavior modifications discussed and diet history reviewed.   Pt will continue to exercise regularly and modify diet with low GI, plant based foods and decrease intake of processed foods.  Recommend intake of daily multivitamin, Vitamin D, and calcium.  Recommend self  breast exams for preventive screenings, as well as recommend immunizations that include influenza, TDAP   2. Need for influenza vaccination Influenza vaccine administered Encouraged to take Tylenol as  needed for fever or muscle aches. - Flu Vaccine QUAD 6mo+IM (Fluarix, Fluzone & Alfiuria Quad PF)  3. Class 2 obesity due to excess calories without serious comorbidity with body mass index (BMI) of 36.0 to 36.9 in adult She is encouraged to strive for BMI less than 30 to decrease cardiac risk. Advised to aim for at least 150 minutes of exercise per week. Will refer to healthy weight and wellness in [place] to help with accountability  - Amb Ref to Medical Weight Management  4. Other fatigue Comments: This may be related to her being a new mom, deconditioning or metabolic cause - CBC - CMP14+EGFR - Vitamin B12 - TSH - VITAMIN D 25 Hydroxy (Vit-D Deficiency, Fractures)  She is encouraged to strive for BMI less than 30 to decrease cardiac risk. Advised to aim for at least 150 minutes of exercise per week.    Patient was given opportunity to ask questions. Patient verbalized understanding of the plan and was able to repeat key elements of the plan. All questions were answered to their satisfaction.    , FNP   I,  , FNP, have reviewed all documentation for this visit. The documentation on 06/13/22 for the exam, diagnosis, procedures, and orders are all accurate and complete.   THE PATIENT IS ENCOURAGED TO PRACTICE SOCIAL DISTANCING DUE TO THE COVID-19 PANDEMIC.   

## 2022-06-13 NOTE — Patient Instructions (Signed)

## 2022-06-14 LAB — CMP14+EGFR
ALT: 19 IU/L (ref 0–32)
AST: 19 IU/L (ref 0–40)
Albumin/Globulin Ratio: 1.6 (ref 1.2–2.2)
Albumin: 4.5 g/dL (ref 4.0–5.0)
Alkaline Phosphatase: 110 IU/L (ref 44–121)
BUN/Creatinine Ratio: 15 (ref 9–23)
BUN: 10 mg/dL (ref 6–20)
Bilirubin Total: 0.3 mg/dL (ref 0.0–1.2)
CO2: 26 mmol/L (ref 20–29)
Calcium: 9.4 mg/dL (ref 8.7–10.2)
Chloride: 102 mmol/L (ref 96–106)
Creatinine, Ser: 0.66 mg/dL (ref 0.57–1.00)
Globulin, Total: 2.8 g/dL (ref 1.5–4.5)
Glucose: 93 mg/dL (ref 70–99)
Potassium: 3.6 mmol/L (ref 3.5–5.2)
Sodium: 141 mmol/L (ref 134–144)
Total Protein: 7.3 g/dL (ref 6.0–8.5)
eGFR: 122 mL/min/{1.73_m2} (ref >59)

## 2022-06-14 LAB — CBC
Hematocrit: 40.1 % (ref 34.0–46.6)
Hemoglobin: 13.3 g/dL (ref 11.1–15.9)
MCH: 26.7 pg (ref 26.6–33.0)
MCHC: 33.2 g/dL (ref 31.5–35.7)
MCV: 80 fL (ref 79–97)
Platelets: 239 10*3/uL (ref 150–450)
RBC: 4.99 x10E6/uL (ref 3.77–5.28)
RDW: 15.2 % (ref 11.7–15.4)
WBC: 8.7 10*3/uL (ref 3.4–10.8)

## 2022-06-14 LAB — TSH: TSH: 0.916 u[IU]/mL (ref 0.450–4.500)

## 2022-06-14 LAB — VITAMIN D 25 HYDROXY (VIT D DEFICIENCY, FRACTURES): Vit D, 25-Hydroxy: 22.4 ng/mL — ABNORMAL LOW (ref 30.0–100.0)

## 2022-06-14 LAB — VITAMIN B12: Vitamin B-12: 773 pg/mL (ref 232–1245)

## 2022-06-20 ENCOUNTER — Encounter: Payer: Self-pay | Admitting: Bariatrics

## 2022-06-20 ENCOUNTER — Ambulatory Visit (INDEPENDENT_AMBULATORY_CARE_PROVIDER_SITE_OTHER): Payer: 59 | Admitting: Bariatrics

## 2022-06-20 VITALS — BP 104/68 | HR 74 | Temp 98.1°F | Ht 65.0 in | Wt 213.0 lb

## 2022-06-20 DIAGNOSIS — Z6835 Body mass index (BMI) 35.0-35.9, adult: Secondary | ICD-10-CM | POA: Diagnosis not present

## 2022-06-20 DIAGNOSIS — E559 Vitamin D deficiency, unspecified: Secondary | ICD-10-CM | POA: Diagnosis not present

## 2022-06-20 DIAGNOSIS — E669 Obesity, unspecified: Secondary | ICD-10-CM | POA: Diagnosis not present

## 2022-06-20 DIAGNOSIS — Z833 Family history of diabetes mellitus: Secondary | ICD-10-CM

## 2022-07-01 MED ORDER — VITAMIN D (ERGOCALCIFEROL) 1.25 MG (50000 UNIT) PO CAPS: 50000.0000 [IU] | ORAL_CAPSULE | ORAL | 1 refills | Status: DC

## 2022-07-04 ENCOUNTER — Encounter: Payer: Self-pay | Admitting: Bariatrics

## 2022-07-04 DIAGNOSIS — Z0289 Encounter for other administrative examinations: Secondary | ICD-10-CM

## 2022-07-04 NOTE — Progress Notes (Signed)
Office: [phone]  /  Fax: [phone]   Initial Visit  Gina Frost was seen in clinic today to evaluate for obesity. She is interested in losing weight to improve overall health and reduce the risk of weight related complications. She presents today to review program treatment options, initial physical assessment, and evaluation.     She was referred by: PCP  When asked what else they would like to accomplish? She states: Improve quality of life, Improve appearance, and Improve self-confidence  When asked how has your weight affected you? She states: Having fatigue  Some associated conditions: Diabetes type II (family history)  Contributing factors: Family history and Pregnancy  Current nutrition plan: High-protein and Portion control / smart choices  Current level of physical activity: Other: varies  Current or previous pharmacotherapy: None  Response to medication: Never tried medications   Past medical history includes:   Past Medical History:  Diagnosis Date   Eczema    Eczema      Objective:   BP 104/68   Pulse 74   Temp 98.1 F (36.7 C)   Ht 5\' 5"  (1.651 m)   Wt 213 lb (96.6 kg)   LMP 05/09/2022 (Approximate)   SpO2 99%   BMI 35.45 kg/m  She was weighed on the bioimpedance scale: Body mass index is 35.45 kg/m. Visceral Fat Rating:7, Body Fat%:36.9.  General:  Alert, oriented and cooperative. Patient is in no acute distress.  Respiratory: Normal respiratory effort, no problems with respiration noted  Extremities: Normal range of motion.    Mental Status: Normal mood and affect. Normal behavior. Normal judgment and thought content.   Assessment and Plan:  1. Family history of diabetes mellitus Lizmarie has a paternal and maternal family history of diabetes mellitus.  She has no issues currently.  We will obtain insulin level, glucose, A1c, and thyroid panel at her new patient visit.  2. Vitamin D deficiency Keiyana is taking prenatal  vitamins.  She will continue her prenatal vitamins as directed.  3. Obesity, Current BMI 35.5 Mackenze is to try to lose about 20 pounds (breast-feeding).  Reviewed labs from 06/13/2022, vitamin D, B12, CMP, and CBC.  Patient will continue her prenatal vitamins.   We reviewed weight, biometrics, associated medical conditions and contributing factors with patient. She would benefit from weight loss therapy via a modified calorie, low-carb, high-protein nutritional plan tailored to their REE (resting energy expenditure) which will be determined by indirect calorimetry.  We will also assess for cardiometabolic risk and nutritional derangements via fasting serologies at her next appointment.      Obesity Treatment / Action Plan:  Will work on eliminating or reducing the presence of highly palatable, calorie dense foods in the home. Will be scheduled for indirect calorimetry to determine resting energy expenditure in a fasting state.  This will allow 06/15/2022 to create a reduced calorie, high-protein meal plan to promote loss of fat mass while preserving muscle mass. Will work on increasing water intake with a goal of 125 ounces for men and 91 ounces for women.   Obesity Education Performed Today:  She was weighed on the bioimpedance scale and results were discussed and documented in the synopsis.  We discussed obesity as a disease and the importance of a more detailed evaluation of all the factors contributing to the disease.  We discussed the importance of long term lifestyle changes which include nutrition, exercise and behavioral modifications as well as the importance of customizing this to her specific health  and social needs.  We discussed the benefits of reaching a healthier weight to alleviate the symptoms of existing conditions and reduce the risks of the biomechanical, metabolic and psychological effects of obesity.  EDWINA GROSSBERG appears to be in the action stage of change and states  they are ready to start intensive lifestyle modifications and behavioral modifications.   Reviewed by clinician on day of visit: allergies, medications, problem list, medical history, surgical history, family history, social history, and previous encounter notes.   Trude Mcburney, am acting as transcriptionist for [employer], DO   I have reviewed the above documentation for accuracy and completeness, and I agree with the above. Corinna Capra, DO

## 2022-07-05 ENCOUNTER — Ambulatory Visit (INDEPENDENT_AMBULATORY_CARE_PROVIDER_SITE_OTHER): Payer: 59 | Admitting: Bariatrics

## 2022-07-05 ENCOUNTER — Encounter: Payer: Self-pay | Admitting: Bariatrics

## 2022-07-05 VITALS — BP 105/69 | HR 76 | Temp 97.8°F | Ht 65.0 in | Wt 215.0 lb

## 2022-07-05 DIAGNOSIS — Z6835 Body mass index (BMI) 35.0-35.9, adult: Secondary | ICD-10-CM | POA: Diagnosis not present

## 2022-07-05 DIAGNOSIS — E559 Vitamin D deficiency, unspecified: Secondary | ICD-10-CM

## 2022-07-05 DIAGNOSIS — Z833 Family history of diabetes mellitus: Secondary | ICD-10-CM | POA: Diagnosis not present

## 2022-07-05 DIAGNOSIS — R0602 Shortness of breath: Secondary | ICD-10-CM | POA: Diagnosis not present

## 2022-07-05 DIAGNOSIS — E669 Obesity, unspecified: Secondary | ICD-10-CM | POA: Diagnosis not present

## 2022-07-05 DIAGNOSIS — R5383 Other fatigue: Secondary | ICD-10-CM | POA: Diagnosis not present

## 2022-07-05 DIAGNOSIS — Z1331 Encounter for screening for depression: Secondary | ICD-10-CM | POA: Diagnosis not present

## 2022-07-05 DIAGNOSIS — F32A Depression, unspecified: Secondary | ICD-10-CM | POA: Insufficient documentation

## 2022-07-05 DIAGNOSIS — Z0000 Encounter for general adult medical examination without abnormal findings: Secondary | ICD-10-CM | POA: Insufficient documentation

## 2022-07-06 LAB — TSH+T4F+T3FREE
Free T4: 1.01 ng/dL (ref 0.82–1.77)
T3, Free: 3 pg/mL (ref 2.0–4.4)
TSH: 1.11 u[IU]/mL (ref 0.450–4.500)

## 2022-07-06 LAB — COMPREHENSIVE METABOLIC PANEL
ALT: 19 IU/L (ref 0–32)
AST: 18 IU/L (ref 0–40)
Albumin/Globulin Ratio: 1.8 (ref 1.2–2.2)
Albumin: 4.6 g/dL (ref 4.0–5.0)
Alkaline Phosphatase: 100 IU/L (ref 44–121)
BUN/Creatinine Ratio: 14 (ref 9–23)
BUN: 10 mg/dL (ref 6–20)
Bilirubin Total: 0.4 mg/dL (ref 0.0–1.2)
CO2: 24 mmol/L (ref 20–29)
Calcium: 9.8 mg/dL (ref 8.7–10.2)
Chloride: 103 mmol/L (ref 96–106)
Creatinine, Ser: 0.73 mg/dL (ref 0.57–1.00)
Globulin, Total: 2.5 g/dL (ref 1.5–4.5)
Glucose: 86 mg/dL (ref 70–99)
Potassium: 4.6 mmol/L (ref 3.5–5.2)
Sodium: 142 mmol/L (ref 134–144)
Total Protein: 7.1 g/dL (ref 6.0–8.5)
eGFR: 114 mL/min/{1.73_m2} (ref >59)

## 2022-07-06 LAB — LIPID PANEL WITH LDL/HDL RATIO
Cholesterol, Total: 206 mg/dL — ABNORMAL HIGH (ref 100–199)
HDL: 85 mg/dL (ref >39)
LDL Chol Calc (NIH): 107 mg/dL — ABNORMAL HIGH (ref 0–99)
LDL/HDL Ratio: 1.3 ratio (ref 0.0–3.2)
Triglycerides: 80 mg/dL (ref 0–149)
VLDL Cholesterol Cal: 14 mg/dL (ref 5–40)

## 2022-07-06 LAB — HEMOGLOBIN A1C
Est. average glucose Bld gHb Est-mCnc: 114 mg/dL
Hgb A1c MFr Bld: 5.6 % (ref 4.8–5.6)

## 2022-07-06 LAB — INSULIN, RANDOM: INSULIN: 14.2 u[IU]/mL (ref 2.6–24.9)

## 2022-07-10 ENCOUNTER — Encounter (INDEPENDENT_AMBULATORY_CARE_PROVIDER_SITE_OTHER): Payer: Self-pay | Admitting: Bariatrics

## 2022-07-10 DIAGNOSIS — E7800 Pure hypercholesterolemia, unspecified: Secondary | ICD-10-CM | POA: Insufficient documentation

## 2022-07-10 DIAGNOSIS — E88819 Insulin resistance, unspecified: Secondary | ICD-10-CM | POA: Insufficient documentation

## 2022-07-19 ENCOUNTER — Encounter: Payer: Self-pay | Admitting: Bariatrics

## 2022-07-19 ENCOUNTER — Ambulatory Visit (INDEPENDENT_AMBULATORY_CARE_PROVIDER_SITE_OTHER): Payer: 59 | Admitting: Bariatrics

## 2022-07-19 VITALS — BP 97/59 | HR 65 | Temp 98.0°F | Ht 65.0 in | Wt 216.0 lb

## 2022-07-19 DIAGNOSIS — E669 Obesity, unspecified: Secondary | ICD-10-CM

## 2022-07-19 DIAGNOSIS — E88819 Insulin resistance, unspecified: Secondary | ICD-10-CM | POA: Diagnosis not present

## 2022-07-19 DIAGNOSIS — Z833 Family history of diabetes mellitus: Secondary | ICD-10-CM

## 2022-07-19 DIAGNOSIS — Z6836 Body mass index (BMI) 36.0-36.9, adult: Secondary | ICD-10-CM

## 2022-07-19 DIAGNOSIS — E559 Vitamin D deficiency, unspecified: Secondary | ICD-10-CM | POA: Diagnosis not present

## 2022-07-24 NOTE — Progress Notes (Unsigned)
Chief Complaint:   OBESITY Gina Frost (MR# [id number]) is a 32 y.o. female who presents for evaluation and treatment of obesity and related comorbidities. Current BMI is Body mass index is 35.78 kg/m. [name] has been struggling with her weight for many years and has been unsuccessful in either losing weight, maintaining weight loss, or reaching her healthy weight goal.  Gina Frost was here for an information session on 06/20/2022. She has been breastfeeding (son is 5 months).   [name] is currently in the action stage of change and ready to dedicate time achieving and maintaining a healthier weight. [name] is interested in becoming our patient and working on intensive lifestyle modifications including (but not limited to) diet and exercise for weight loss.  [name]'s habits were reviewed today and are as follows: {MWM WT HABITS:23461}.  Depression Screen [name]'s Food and Mood (modified PHQ-9) score was 5.  Subjective:   1. Other fatigue Gina Frost admits to daytime somnolence and admits to waking up still tired. Patient has a history of symptoms of daytime fatigue and morning fatigue. Gina Frost generally gets 3 or 5 hours of sleep per night, and states that she has nightime awakenings. Snoring is present. Apneic episodes are not present. Epworth Sleepiness Score is 9.   2. SOB (shortness of breath) on exertion Gina Frost notes increasing shortness of breath with exercising and seems to be worsening over time with weight gain. She notes getting out of breath sooner with activity than she used to. This has not gotten worse recently. [name] denies shortness of breath at rest or orthopnea.  3. Family history of diabetes mellitus ***  4. Vitamin D deficiency ***  5. Health care maintenance Given obesity.  Assessment/Plan:   1. Other fatigue Gina Frost does feel that her weight is causing her energy to be lower than it should be. Fatigue may be related to obesity, depression or many other  causes. Labs will be ordered, and in the meanwhile, Gina Frost will focus on self care including making healthy food choices, increasing physical activity and focusing on stress reduction.  - EKG 12-Lead - TSH+T4F+T3Free  2. SOB (shortness of breath) on exertion Gina Frost does feel that she gets out of breath more easily that she used to when she exercises. Gina Frost's shortness of breath appears to be obesity related and exercise induced. She has agreed to work on weight loss and gradually increase exercise to treat her exercise induced shortness of breath. Will continue to monitor closely.  - TSH+T4F+T3Free  3. Family history of diabetes mellitus *** - Comprehensive metabolic panel - Hemoglobin A1c - Insulin, random - Lipid Panel With LDL/HDL Ratio - TSH+T4F+T3Free  4. Vitamin D deficiency ***  5. Health care maintenance *** - Comprehensive metabolic panel - Hemoglobin A1c - Insulin, random - Lipid Panel With LDL/HDL Ratio - TSH+T4F+T3Free  6. Depression screening Gina Frost had a positive depression screening. Depression is commonly associated with obesity and often results in emotional eating behaviors. We will monitor this closely and work on CBT to help improve the non-hunger eating patterns. Referral to Psychology may be required if no improvement is seen as she continues in our clinic.  7. Obesity, Current BMI 35.9 Gina Frost is currently in the action stage of change and her goal is to continue with weight loss efforts. I recommend Gina Frost begin the structured treatment plan as follows:  She has agreed to the Category 3 Plan + 100 calories.  Meal planning and mindful eating were discussed. Reviewed labs with the patient  from 06/13/2022, CMP, Vitamin D, B12, CBC, and TSH.   Exercise goals: No exercise has been prescribed at this time.   Behavioral modification strategies: increasing lean protein intake, decreasing simple carbohydrates, increasing vegetables, increasing water intake,  decreasing eating out, no skipping meals, meal planning and cooking strategies, keeping healthy foods in the home, and keeping a strict food journal.  She was informed of the importance of frequent follow-up visits to maximize her success with intensive lifestyle modifications for her multiple health conditions. She was informed we would discuss her lab results at her next visit unless there is a critical issue that needs to be addressed sooner. Gina Frost agreed to keep her next visit at the agreed upon time to discuss these results.  Objective:   Blood pressure 105/69, pulse 76, temperature 97.8 F (36.6 C), height 5\' 5"  (1.651 m), weight 215 lb (97.5 kg), last menstrual period 05/09/2022, SpO2 100 %, currently breastfeeding. Body mass index is 35.78 kg/m.  EKG: Normal sinus rhythm, rate 74 BPM.  Indirect Calorimeter completed today shows a VO2 of 227 and a REE of 1570.  Her calculated basal metabolic rate is 3382 thus her basal metabolic rate is worse than expected.  General: Cooperative, alert, well developed, in no acute distress. HEENT: Conjunctivae and lids unremarkable. Cardiovascular: Regular rhythm.  Lungs: Normal work of breathing. Neurologic: No focal deficits.   Lab Results  Component Value Date   CREATININE 0.73 07/05/2022   BUN 10 07/05/2022   NA 142 07/05/2022   K 4.6 07/05/2022   CL 103 07/05/2022   CO2 24 07/05/2022   Lab Results  Component Value Date   ALT 19 07/05/2022   AST 18 07/05/2022   ALKPHOS 100 07/05/2022   BILITOT 0.4 07/05/2022   Lab Results  Component Value Date   HGBA1C 5.6 07/05/2022   HGBA1C 5.7 (H) 07/27/2021   HGBA1C 5.8 (H) 03/15/2021   Lab Results  Component Value Date   INSULIN 14.2 07/05/2022   INSULIN 56.4 (H) 03/15/2021   Lab Results  Component Value Date   TSH 1.110 07/05/2022   Lab Results  Component Value Date   CHOL 206 (H) 07/05/2022   HDL 85 07/05/2022   LDLCALC 107 (H) 07/05/2022   TRIG 80 07/05/2022   CHOLHDL 3.0  03/15/2021   Lab Results  Component Value Date   WBC 8.7 06/13/2022   HGB 13.3 06/13/2022   HCT 40.1 06/13/2022   MCV 80 06/13/2022   PLT 239 06/13/2022   No results found for: "IRON", "TIBC", "FERRITIN"  Attestation Statements:   Reviewed by clinician on day of visit: allergies, medications, problem list, medical history, surgical history, family history, social history, and previous encounter notes.   Wilhemena Durie, am acting as Location manager for [employer], DO.  I have reviewed the above documentation for accuracy and completeness, and I agree with the above. - ***

## 2022-07-31 NOTE — Progress Notes (Signed)
Chief Complaint:   OBESITY Gina Frost is here to discuss her progress with her obesity treatment plan along with follow-up of her obesity related diagnoses. [name] is on the Category 3 Plan and states she is following her eating plan approximately 10% of the time. [name] states she is walking for 10 minutes 3 times per week. Today's visit was #: 2 Starting weight: 07/05/22 Starting date: 215 Today's weight: 216 lbs Today's date: 07/19/22 Total lbs lost to date: 0 Total lbs lost since last in-office visit: +1  Interim History: She is up 1 pound since her first visit.  She struggled with the plan over the holiday.  Subjective:   1. Insulin resistance No medications. A1c-5.6. Insulin-14.2.  2. Family history of diabetes mellitus Has family history of diabetes.  3.  Vitamin D deficiency Taking vitamin D. Vitamin D-22.4.  Assessment/Plan:   1. Insulin resistance 1.  Handout-insulin resistance and prediabetes. 2.  Insulin goal less than 10.  2. Family history of diabetes mellitus 1.  Will develop lifelong skills to combat obesity/diabetes. 2.  Continue to work on meal plan and exercise.  3. Vitamin D deficiency Continue vitamin D.  4. Obesity, Current BMI 36.0 1.  Meal planning 2.  Reviewed labs from 07/05/2022: CMP, lipids, A1c, insulin, and thyroid panel. 3.  Will do some journaling.  4.  Will limit eating out. "Eating out 6 times per week"  [name] is currently in the action stage of change. As such, her goal is to continue with weight loss efforts. She has agreed to the Category 3 Plan.   Exercise goals: No exercise has been prescribed at this time.  Behavioral modification strategies: increasing lean protein intake, decreasing simple carbohydrates, increasing vegetables, increasing water intake, decreasing eating out, no skipping meals, meal planning and cooking strategies, keeping healthy foods in the home, and planning for success.  [name] has agreed to  follow-up with our clinic in 2 weeks. She was informed of the importance of frequent follow-up visits to maximize her success with intensive lifestyle modifications for her multiple health conditions.   Objective:   Blood pressure (!) 97/59, pulse 65, temperature 98 F (36.7 C), height 5\' 5"  (1.651 m), weight 216 lb (98 kg), SpO2 100 %, currently breastfeeding. Body mass index is 35.94 kg/m.  General: Cooperative, alert, well developed, in no acute distress. HEENT: Conjunctivae and lids unremarkable. Cardiovascular: Regular rhythm.  Lungs: Normal work of breathing. Neurologic: No focal deficits.   Lab Results  Component Value Date   CREATININE 0.73 07/05/2022   BUN 10 07/05/2022   NA 142 07/05/2022   K 4.6 07/05/2022   CL 103 07/05/2022   CO2 24 07/05/2022   Lab Results  Component Value Date   ALT 19 07/05/2022   AST 18 07/05/2022   ALKPHOS 100 07/05/2022   BILITOT 0.4 07/05/2022   Lab Results  Component Value Date   HGBA1C 5.6 07/05/2022   HGBA1C 5.7 (H) 07/27/2021   HGBA1C 5.8 (H) 03/15/2021   Lab Results  Component Value Date   INSULIN 14.2 07/05/2022   INSULIN 56.4 (H) 03/15/2021   Lab Results  Component Value Date   TSH 1.110 07/05/2022   Lab Results  Component Value Date   CHOL 206 (H) 07/05/2022   HDL 85 07/05/2022   LDLCALC 107 (H) 07/05/2022   TRIG 80 07/05/2022   CHOLHDL 3.0 03/15/2021   Lab Results  Component Value Date   VD25OH 22.4 (L) 06/13/2022   Lab Results  Component  Value Date   WBC 8.7 06/13/2022   HGB 13.3 06/13/2022   HCT 40.1 06/13/2022   MCV 80 06/13/2022   PLT 239 06/13/2022   No results found for: "IRON", "TIBC", "FERRITIN"   Attestation Statements:   Reviewed by clinician on day of visit: allergies, medications, problem list, medical history, surgical history, family history, social history, and previous encounter notes.  I, Dawn Whitmire, FNP-C, am acting as transcriptionist for Dr. Jearld Lesch.  I have reviewed the  above documentation for accuracy and completeness, and I agree with the above. Jearld Lesch, DO

## 2022-08-01 NOTE — Progress Notes (Signed)
TeleHealth Visit:  This visit was completed with telemedicine (audio/video) technology. Gina Frost has verbally consented to this TeleHealth visit. The patient is located at home, the provider is located at home. The participants in this visit include the listed provider and patient. The visit was conducted today via MyChart video.  OBESITY Gina Frost is here to discuss her progress with her obesity treatment plan along with follow-up of her obesity related diagnoses.   Today's visit was # 3 Starting weight: 215 lbs Starting date: 07/05/22 Weight at last in office visit: 216 lbs on 07/19/22 Total weight loss: 0 lbs at last in office visit on 07/19/22. Today's reported weight:  No weight reported.  Nutrition Plan: the Category 3 Plan. - 85% adherence  Current exercise:  cardio and weight machines for 30-35 minutes 3-4 times per week.  Interim History:  Gina Frost reports she did not follow the plan over the holidays but started following it consistently after January 1.  She is tweaking the plan and sometimes adding extra food because she is breast-feeding.  For instance she might add an extra egg at breakfast. Overall hunger is satisfied.  She is using the dining out guide when she and her husband eat out for dinner.  She is keeping snack calories less than 300/day.  She has good water intake. She has been working out 3-4 times per week for the last 3 weeks.  Assessment/Plan:  1. Prediabetes Most recent A1c was 5.6 on 07/05/2022, down from 5.7 on 07/16/2021. Denies polyphagia. Not on medication. Lab Results  Component Value Date   HGBA1C 5.6 07/05/2022   Lab Results  Component Value Date   INSULIN 14.2 07/05/2022   INSULIN 56.4 (H) 03/15/2021    Plan: Continue to follow the meal plan and focus on increasing protein and limiting carbs.  2. Obesity: Current BMI 36.0 Gina Frost is currently in the action stage of change. As such, her goal is to continue with weight loss efforts.  She has  agreed to the Category 3 Plan and keeping a food journal and adhering to recommended goals of 450-600 calories and 40 gms protein.at dinner.  1.  Calorie and protein amount at dinner sent via [facility].  Exercise goals:  as is  Behavioral modification strategies: increasing lean protein intake, decreasing simple carbohydrates, and planning for success.  Gina Frost has agreed to follow-up with our clinic in 3 weeks.   No orders of the defined types were placed in this encounter.   There are no discontinued medications.   No orders of the defined types were placed in this encounter.     Objective:   VITALS: Per patient if applicable, see vitals. GENERAL: Alert and in no acute distress. CARDIOPULMONARY: No increased WOB. Speaking in clear sentences.  PSYCH: Pleasant and cooperative. Speech normal rate and rhythm. Affect is appropriate. Insight and judgement are appropriate. Attention is focused, linear, and appropriate.  NEURO: Oriented as arrived to appointment on time with no prompting.   Lab Results  Component Value Date   CREATININE 0.73 07/05/2022   BUN 10 07/05/2022   NA 142 07/05/2022   K 4.6 07/05/2022   CL 103 07/05/2022   CO2 24 07/05/2022   Lab Results  Component Value Date   ALT 19 07/05/2022   AST 18 07/05/2022   ALKPHOS 100 07/05/2022   BILITOT 0.4 07/05/2022   Lab Results  Component Value Date   HGBA1C 5.6 07/05/2022   HGBA1C 5.7 (H) 07/27/2021   HGBA1C 5.8 (H) 03/15/2021   Lab  Results  Component Value Date   INSULIN 14.2 07/05/2022   INSULIN 56.4 (H) 03/15/2021   Lab Results  Component Value Date   TSH 1.110 07/05/2022   Lab Results  Component Value Date   CHOL 206 (H) 07/05/2022   HDL 85 07/05/2022   LDLCALC 107 (H) 07/05/2022   TRIG 80 07/05/2022   CHOLHDL 3.0 03/15/2021   Lab Results  Component Value Date   WBC 8.7 06/13/2022   HGB 13.3 06/13/2022   HCT 40.1 06/13/2022   MCV 80 06/13/2022   PLT 239 06/13/2022   No results found for:  "IRON", "TIBC", "FERRITIN" Lab Results  Component Value Date   VD25OH 22.4 (L) 06/13/2022    Attestation Statements:   Reviewed by clinician on day of visit: allergies, medications, problem list, medical history, surgical history, family history, social history, and previous encounter notes.

## 2022-08-02 ENCOUNTER — Encounter (INDEPENDENT_AMBULATORY_CARE_PROVIDER_SITE_OTHER): Payer: Self-pay | Admitting: Family Medicine

## 2022-08-02 ENCOUNTER — Telehealth (INDEPENDENT_AMBULATORY_CARE_PROVIDER_SITE_OTHER): Payer: 59 | Admitting: Family Medicine

## 2022-08-02 ENCOUNTER — Encounter: Payer: Self-pay | Admitting: Bariatrics

## 2022-08-02 DIAGNOSIS — Z6836 Body mass index (BMI) 36.0-36.9, adult: Secondary | ICD-10-CM | POA: Diagnosis not present

## 2022-08-02 DIAGNOSIS — R7303 Prediabetes: Secondary | ICD-10-CM | POA: Diagnosis not present

## 2022-08-02 DIAGNOSIS — E669 Obesity, unspecified: Secondary | ICD-10-CM | POA: Diagnosis not present

## 2022-08-23 ENCOUNTER — Ambulatory Visit: Payer: 59 | Admitting: Bariatrics

## 2022-08-29 ENCOUNTER — Encounter: Payer: Self-pay | Admitting: Nurse Practitioner

## 2022-08-29 ENCOUNTER — Ambulatory Visit (INDEPENDENT_AMBULATORY_CARE_PROVIDER_SITE_OTHER): Payer: 59 | Admitting: Nurse Practitioner

## 2022-08-29 VITALS — BP 111/68 | HR 66 | Temp 97.8°F | Ht 65.0 in | Wt 209.0 lb

## 2022-08-29 DIAGNOSIS — E669 Obesity, unspecified: Secondary | ICD-10-CM

## 2022-08-29 DIAGNOSIS — Z6834 Body mass index (BMI) 34.0-34.9, adult: Secondary | ICD-10-CM | POA: Diagnosis not present

## 2022-08-29 DIAGNOSIS — E559 Vitamin D deficiency, unspecified: Secondary | ICD-10-CM

## 2022-08-29 NOTE — Progress Notes (Signed)
Office: [phone]  /  Fax: [phone]  WEIGHT SUMMARY AND BIOMETRICS  Medical Weight Loss Height: 5' 5"$  (1.651 m) Weight: 209 lb (94.8 kg) Temp: 97.8 F (36.6 C) Pulse Rate: 66 BP: 111/68 SpO2: 100 % Today's Visit #: 4 Weight at Last VIsit: 216lb Weight Lost Since Last Visit: 7lb  Body Fat %: 37.4 % Fat Mass (lbs): 78.4 lbs Muscle Mass (lbs): 124.6 lbs Total Body Water (lbs): 83 lbs Visceral Fat Rating : 7 Starting Date: 07/05/22 Starting Weight: 215lb Total Weight Loss (lbs): 6 lb (2.722 kg)    HPI  Chief Complaint: OBESITY  Gina Frost is here to discuss her progress with her obesity treatment plan. She is on the the Category 3 Plan and states she is following her eating plan approximately 75-85 % of the time. She states she is exercising 60-90 minutes 3 days per week.   Interval History:  Since last office visit she has lost 7 lbs.  She is aiming to eat more protein. She is eating 3 meals and 2-4 snacks per day.  She is snacking on 100 cal Special K bars, 100 cal almond packs, fruit and cheese snacks. She is drinking water daily. She is breast feeding. She is struggling with hunger and cravings.  She used to be a Physiological scientist.    Pharmacotherapy for weight loss: she is currently not taking any medications for medical weight loss.     Previous pharmacotherapy for medical weight loss:   she has not tried weight loss medications in the past.      PHYSICAL EXAM:  Blood pressure 111/68, pulse 66, temperature 97.8 F (36.6 C), height 5' 5"$  (1.651 m), weight 209 lb (94.8 kg), last menstrual period 08/24/2022, SpO2 100 %, currently breastfeeding. Body mass index is 34.78 kg/m.  General: She is overweight, cooperative, alert, well developed, and in no acute distress. PSYCH: Has normal mood, affect and thought process.   Extremities: No edema.  Neurologic: No gross sensory or motor deficits. No tremors or fasciculations noted.    DIAGNOSTIC DATA  REVIEWED:  BMET    Component Value Date/Time   NA 142 07/05/2022 0908   K 4.6 07/05/2022 0908   CL 103 07/05/2022 0908   CO2 24 07/05/2022 0908   GLUCOSE 86 07/05/2022 0908   GLUCOSE 115 (H) 11/04/2021 0920   BUN 10 07/05/2022 0908   CREATININE 0.73 07/05/2022 0908   CALCIUM 9.8 07/05/2022 0908   GFRNONAA >60 02/02/2022 0520   Lab Results  Component Value Date   HGBA1C 5.6 07/05/2022   HGBA1C 5.8 (H) 03/15/2021   Lab Results  Component Value Date   INSULIN 14.2 07/05/2022   INSULIN 56.4 (H) 03/15/2021   Lab Results  Component Value Date   TSH 1.110 07/05/2022   CBC    Component Value Date/Time   WBC 8.7 06/13/2022 0908   WBC 15.2 (H) 02/02/2022 0520   RBC 4.99 06/13/2022 0908   RBC 3.95 02/02/2022 0520   HGB 13.3 06/13/2022 0908   HCT 40.1 06/13/2022 0908   PLT 239 06/13/2022 0908   MCV 80 06/13/2022 0908   MCH 26.7 06/13/2022 0908   MCH 27.1 02/02/2022 0520   MCHC 33.2 06/13/2022 0908   MCHC 34.1 02/02/2022 0520   RDW 15.2 06/13/2022 0908   Iron Studies No results found for: "IRON", "TIBC", "FERRITIN", "IRONPCTSAT" Lipid Panel     Component Value Date/Time   CHOL 206 (H) 07/05/2022 0908   TRIG 80 07/05/2022 0908   HDL 85  07/05/2022 0908   CHOLHDL 3.0 03/15/2021 1233   LDLCALC 107 (H) 07/05/2022 0908   Hepatic Function Panel     Component Value Date/Time   PROT 7.1 07/05/2022 0908   ALBUMIN 4.6 07/05/2022 0908   AST 18 07/05/2022 0908   ALT 19 07/05/2022 0908   ALKPHOS 100 07/05/2022 0908   BILITOT 0.4 07/05/2022 0908      Component Value Date/Time   TSH 1.110 07/05/2022 0908   Nutritional Lab Results  Component Value Date   VD25OH 22.4 (L) 06/13/2022     ASSESSMENT AND PLAN  TREATMENT PLAN FOR OBESITY:  Recommended Dietary Goals  Gina Frost is currently in the action stage of change. As such, her goal is to continue weight management plan. She has agreed to the Category 3 Plan.  Behavioral Intervention  We discussed the following  Behavioral Modification Strategies today: increasing lean protein intake, decreasing simple carbohydrates , increasing vegetables, increase water intake, work on meal planning and easy cooking plans, and think about ways to increase physical activity.  Additional resources provided today: NA  Recommended Physical Activity Goals  Gina Frost has been advised to work up to 150 minutes of moderate intensity aerobic activity a week and strengthening exercises 2-3 times per week for cardiovascular health, weight loss maintenance and preservation of muscle mass.   She has agreed to continue physical activity as is.    ASSOCIATED CONDITIONS ADDRESSED TODAY  Action/Plan  Vitamin D deficiency Last Vit D was 22.4.  She is taking Vit D 50,000 IU weekly prescribed by her PCP.  Denies side effects. Has an appt with PCP in March for follow up and labs.     Generalized obesity  BMI 34.0-34.9,adult    Return in about 4 weeks (around 09/26/2022).Marland Kitchen She was informed of the importance of frequent follow up visits to maximize her success with intensive lifestyle modifications for her multiple health conditions.   ATTESTASTION STATEMENTS:  Reviewed by clinician on day of visit: allergies, medications, problem list, medical history, surgical history, family history, social history, and previous encounter notes.   Time spent on visit including pre-visit chart review and post-visit care and charting was 30 minutes.    Ailene Rud. Yarisbel Miranda FNP-C

## 2022-09-24 ENCOUNTER — Ambulatory Visit (INDEPENDENT_AMBULATORY_CARE_PROVIDER_SITE_OTHER): Payer: 59 | Admitting: Nurse Practitioner

## 2022-09-24 ENCOUNTER — Encounter: Payer: Self-pay | Admitting: Nurse Practitioner

## 2022-09-24 VITALS — BP 115/75 | HR 69 | Temp 97.7°F | Ht 65.0 in | Wt 207.0 lb

## 2022-09-24 DIAGNOSIS — E559 Vitamin D deficiency, unspecified: Secondary | ICD-10-CM

## 2022-09-24 DIAGNOSIS — Z6834 Body mass index (BMI) 34.0-34.9, adult: Secondary | ICD-10-CM

## 2022-09-24 DIAGNOSIS — E669 Obesity, unspecified: Secondary | ICD-10-CM

## 2022-09-24 NOTE — Progress Notes (Signed)
Office: [phone]  /  Fax: [phone]  WEIGHT SUMMARY AND BIOMETRICS  Weight Lost Since Last Visit: 2lb  No data recorded  Vitals Temp: 97.7 F (36.5 C) BP: 115/75 Pulse Rate: 69 SpO2: 100 %   Anthropometric Measurements Height: '5\' 5"'$  (1.651 m) Weight: 207 lb (93.9 kg) BMI (Calculated): 34.45 Weight at Last Visit: 209lb Weight Lost Since Last Visit: 2lb Starting Weight: 215lb Total Weight Loss (lbs): 8 lb (3.629 kg)   Body Composition  Body Fat %: 36.3 % Fat Mass (lbs): 75.2 lbs Muscle Mass (lbs): 125.2 lbs Total Body Water (lbs): 81.2 lbs Visceral Fat Rating : 7   Other Clinical Data Today's Visit #: 5 Starting Date: 07/05/22     HPI  Chief Complaint: OBESITY  Gina Frost is here to discuss her progress with her obesity treatment plan. She is on the the Category 3 Plan and states she is following her eating plan approximately 75-80 % of the time. She states she is exercising 30-45 minutes 3 days per week.   Interval History:  Since last office visit she has lost 2 pounds.  She is breast feeding.  She is aiming to eat more protein and is drinking enough water.  She is going to a bridal shower the end of the month.    Pharmacotherapy for weight loss: She is not currently taking medications  for medical weight loss.  Denies side effects.    Previous pharmacotherapy for medical weight loss:  She has not tried weight loss medications in the past.   Bariatric surgery:  Never had bariatric surgery  Vit D deficiency  She is taking Vit D 50,000 IU weekly.  Denies side effects.  Denies nausea, vomiting or muscle weakness.    Lab Results  Component Value Date   VD25OH 22.4 (L) 06/13/2022      PHYSICAL EXAM:  Blood pressure 115/75, pulse 69, temperature 97.7 F (36.5 C), height '5\' 5"'$  (1.651 m), weight 207 lb (93.9 kg), last menstrual period 09/16/2022, SpO2 100 %, currently breastfeeding. Body mass index is 34.45 kg/m.  General: She is overweight,  cooperative, alert, well developed, and in no acute distress. PSYCH: Has normal mood, affect and thought process.   Extremities: No edema.  Neurologic: No gross sensory or motor deficits. No tremors or fasciculations noted.    DIAGNOSTIC DATA REVIEWED:  BMET    Component Value Date/Time   NA 142 07/05/2022 0908   K 4.6 07/05/2022 0908   CL 103 07/05/2022 0908   CO2 24 07/05/2022 0908   GLUCOSE 86 07/05/2022 0908   GLUCOSE 115 (H) 11/04/2021 0920   BUN 10 07/05/2022 0908   CREATININE 0.73 07/05/2022 0908   CALCIUM 9.8 07/05/2022 0908   GFRNONAA >60 02/02/2022 0520   Lab Results  Component Value Date   HGBA1C 5.6 07/05/2022   HGBA1C 5.8 (H) 03/15/2021   Lab Results  Component Value Date   INSULIN 14.2 07/05/2022   INSULIN 56.4 (H) 03/15/2021   Lab Results  Component Value Date   TSH 1.110 07/05/2022   CBC    Component Value Date/Time   WBC 8.7 06/13/2022 0908   WBC 15.2 (H) 02/02/2022 0520   RBC 4.99 06/13/2022 0908   RBC 3.95 02/02/2022 0520   HGB 13.3 06/13/2022 0908   HCT 40.1 06/13/2022 0908   PLT 239 06/13/2022 0908   MCV 80 06/13/2022 0908   MCH 26.7 06/13/2022 0908   MCH 27.1 02/02/2022 0520   MCHC 33.2 06/13/2022 0908   MCHC  34.1 02/02/2022 0520   RDW 15.2 06/13/2022 0908   Iron Studies No results found for: "IRON", "TIBC", "FERRITIN", "IRONPCTSAT" Lipid Panel     Component Value Date/Time   CHOL 206 (H) 07/05/2022 0908   TRIG 80 07/05/2022 0908   HDL 85 07/05/2022 0908   CHOLHDL 3.0 03/15/2021 1233   LDLCALC 107 (H) 07/05/2022 0908   Hepatic Function Panel     Component Value Date/Time   PROT 7.1 07/05/2022 0908   ALBUMIN 4.6 07/05/2022 0908   AST 18 07/05/2022 0908   ALT 19 07/05/2022 0908   ALKPHOS 100 07/05/2022 0908   BILITOT 0.4 07/05/2022 0908      Component Value Date/Time   TSH 1.110 07/05/2022 0908   Nutritional Lab Results  Component Value Date   VD25OH 22.4 (L) 06/13/2022     ASSESSMENT AND PLAN  TREATMENT PLAN  FOR OBESITY:  Recommended Dietary Goals  Ahriana is currently in the action stage of change. As such, her goal is to continue weight management plan. She has agreed to the Category 3 Plan.  Behavioral Intervention  We discussed the following Behavioral Modification Strategies today: increasing lean protein intake, decreasing simple carbohydrates , increasing vegetables, avoiding skipping meals, increasing water intake, and work on meal planning and easy cooking plans.  Additional resources provided today: NA  Recommended Physical Activity Goals  Dazha has been advised to work up to 150 minutes of moderate intensity aerobic activity a week and strengthening exercises 2-3 times per week for cardiovascular health, weight loss maintenance and preservation of muscle mass.   She has agreed to continue physical activity as is.    ASSOCIATED CONDITIONS ADDRESSED TODAY  Action/Plan  Vitamin D deficiency Continue Vit D as directed  Generalized obesity  BMI 34.0-34.9,adult       She is scheduled to see her PCP on the 20th for CPE.  Will follow up with her about her Vit d   Return in about 4 weeks (around 10/22/2022).Marland Kitchen She was informed of the importance of frequent follow up visits to maximize her success with intensive lifestyle modifications for her multiple health conditions.   ATTESTASTION STATEMENTS:  Reviewed by clinician on day of visit: allergies, medications, problem list, medical history, surgical history, family history, social history, and previous encounter notes.   Time spent on visit including pre-visit chart review and post-visit care and charting was 30 minutes.    Ailene Rud. Danaisha Celli FNP-C

## 2022-10-03 ENCOUNTER — Ambulatory Visit (INDEPENDENT_AMBULATORY_CARE_PROVIDER_SITE_OTHER): Payer: 59 | Admitting: Nurse Practitioner

## 2022-10-03 ENCOUNTER — Encounter: Payer: Self-pay | Admitting: Nurse Practitioner

## 2022-10-03 VITALS — BP 120/78 | HR 82 | Temp 98.2°F | Ht 65.0 in | Wt 208.0 lb

## 2022-10-03 DIAGNOSIS — F53 Postpartum depression: Secondary | ICD-10-CM | POA: Diagnosis not present

## 2022-10-03 DIAGNOSIS — E559 Vitamin D deficiency, unspecified: Secondary | ICD-10-CM | POA: Diagnosis not present

## 2022-10-03 MED ORDER — VITAMIN D (ERGOCALCIFEROL) 1.25 MG (50000 UNIT) PO CAPS: 50000.0000 [IU] | ORAL_CAPSULE | ORAL | 1 refills | Status: DC

## 2022-10-03 NOTE — Patient Instructions (Addendum)
[facility] [place], [place] [address] HelpLine: [phone] or [phone]  Keep up the good work with your weight loss

## 2022-10-03 NOTE — Progress Notes (Signed)
I,Sheena H Holbrook,acting as a Education administrator for Minette Brine, FNP.,have documented all relevant documentation on the behalf of Minette Brine, FNP,as directed by  Minette Brine, FNP while in the presence of Minette Brine, [place].    Subjective:     Patient ID: Gina Frost , female    DOB: [birth year] , 32 y.o.   MRN: [id number]   Chief Complaint  Patient presents with   Medical Management of Chronic Issues    HPI  Patient presents today for follow up on Vitamin D deficiency. Patient has been taking Vit D supplement as directed. Patient has no other complaints or concerns. Patient mentioned post-partum depression, is currently in counseling.   Wt Readings from Last 3 Encounters: 10/03/22 : 208 lb (94.3 kg) 09/24/22 : 207 lb (93.9 kg) 08/29/22 : 209 lb (94.8 kg)  Her body fat has decreased from 38% to 36%. She is exercising regularly at least 3 times a week.  She is still breast feeding and does not want to disturb the process. She reports her husband works at night and she feels that may cause some of her anxiety.   She is seeing [place] [facility](therapist) - does virtual.      Past Medical History:  Diagnosis Date   Anxiety    Depression    Eczema    Eczema    Multiple food allergies    Vitamin D deficiency      Family History  Problem Relation Age of Onset   Stroke Mother    Hypertension Mother    Kidney cancer Mother    Hyperlipidemia Father    Alcoholism Father    Lupus Sister    Eczema Sister    Diabetes Maternal Grandmother    Allergic rhinitis Neg Hx    Angioedema Neg Hx    Asthma Neg Hx    Atopy Neg Hx    Immunodeficiency Neg Hx    Urticaria Neg Hx      Current Outpatient Medications:    EPINEPHrine 0.3 mg/0.3 mL IJ SOAJ injection, Inject into the muscle., Disp: , Rfl:    EUCRISA 2 % OINT, , Disp: , Rfl:    Vitamin D, Ergocalciferol, (DRISDOL) 1.25 MG (50000 UNIT) CAPS capsule, Take 1 capsule (50,000 Units total) by mouth every 7 (seven) days., Disp: 12  capsule, Rfl: 1   Allergies  Allergen Reactions   Shellfish Allergy Anaphylaxis     Review of Systems  Constitutional: Negative.   Respiratory: Negative.    Cardiovascular: Negative.   Neurological: Negative.  Negative for dizziness.  Psychiatric/Behavioral: Negative.       Today's Vitals   10/03/22 0826  BP: 120/78  Pulse: 82  Temp: 98.2 F (36.8 C)  TempSrc: Oral  SpO2: 98%  Weight: 208 lb (94.3 kg)  Height: 5\' 5"  (1.651 m)   Body mass index is 34.61 kg/m.   Objective:  Physical Exam Vitals reviewed.  Constitutional:      General: She is not in acute distress.    Appearance: Normal appearance.  Cardiovascular:     Rate and Rhythm: Normal rate and regular rhythm.     Pulses: Normal pulses.     Heart sounds: Normal heart sounds. No murmur heard. Pulmonary:     Effort: Pulmonary effort is normal. No respiratory distress.     Breath sounds: Normal breath sounds. No wheezing.  Skin:    General: Skin is warm and dry.     Capillary Refill: Capillary refill takes less than 2 seconds.  Neurological:     General: No focal deficit present.     Mental Status: She is alert and oriented to person, place, and time.     Cranial Nerves: No cranial nerve deficit.     Motor: No weakness.  Psychiatric:        Mood and Affect: Mood normal.        Behavior: Behavior normal.        Thought Content: Thought content normal.        Judgment: Judgment normal.         Assessment And Plan:     1. Vitamin D deficiency Continue current medications Will check vitamin D level and supplement as needed.    Also encouraged to spend 15 minutes in the sun daily.  - Vitamin D, Ergocalciferol, (DRISDOL) 1.25 MG (50000 UNIT) CAPS capsule; Take 1 capsule (50,000 Units total) by mouth every 7 (seven) days.  Dispense: 12 capsule; Refill: 1 - VITAMIN D 25 Hydroxy (Vit-D Deficiency, Fractures)  2. Postpartum depression Comments: Currently under tx with a therapist, at this time no  medications. Aware if worsens will consider medication tx. Given number for [facility]al [facility] Urgent Care    Patient was given opportunity to ask questions. Patient verbalized understanding of the plan and was able to repeat key elements of the plan. All questions were answered to their satisfaction.  Minette Brine, FNP   I, Minette Brine, FNP, have reviewed all documentation for this visit. The documentation on 10/03/22 for the exam, diagnosis, procedures, and orders are all accurate and complete.   IF YOU HAVE BEEN REFERRED TO A SPECIALIST, IT MAY TAKE 1-2 WEEKS TO SCHEDULE/PROCESS THE REFERRAL. IF YOU HAVE NOT HEARD FROM US/SPECIALIST IN TWO WEEKS, PLEASE GIVE [place] A CALL AT [phone] X [phone].   THE PATIENT IS ENCOURAGED TO PRACTICE SOCIAL DISTANCING DUE TO THE COVID-19 PANDEMIC.

## 2022-10-04 LAB — VITAMIN D 25 HYDROXY (VIT D DEFICIENCY, FRACTURES): Vit D, 25-Hydroxy: 44.6 ng/mL (ref 30.0–100.0)

## 2022-10-30 ENCOUNTER — Ambulatory Visit: Payer: 59 | Admitting: Nurse Practitioner

## 2022-11-01 ENCOUNTER — Encounter: Payer: Self-pay | Admitting: Nurse Practitioner

## 2022-11-01 ENCOUNTER — Ambulatory Visit (INDEPENDENT_AMBULATORY_CARE_PROVIDER_SITE_OTHER): Payer: 59 | Admitting: Nurse Practitioner

## 2022-11-01 VITALS — BP 110/74 | HR 71 | Temp 98.1°F | Ht 65.0 in | Wt 211.0 lb

## 2022-11-01 DIAGNOSIS — Z6835 Body mass index (BMI) 35.0-35.9, adult: Secondary | ICD-10-CM

## 2022-11-01 DIAGNOSIS — E559 Vitamin D deficiency, unspecified: Secondary | ICD-10-CM

## 2022-11-01 DIAGNOSIS — E669 Obesity, unspecified: Secondary | ICD-10-CM | POA: Diagnosis not present

## 2022-11-01 NOTE — Progress Notes (Signed)
Office: [phone]  /  Fax: [phone]  WEIGHT SUMMARY AND BIOMETRICS  No data recorded Weight Gained Since Last Visit: 4lb   Vitals Temp: 98.1 F (36.7 C) BP: 110/74 Pulse Rate: 71 SpO2: 100 %   Anthropometric Measurements Height:  (1.651 m) Weight: 211 lb (95.7 kg) BMI (Calculated): 35.11 Weight at Last Visit: 207lb Weight Gained Since Last Visit: 4lb Starting Weight: 215lb Total Weight Loss (lbs): 4 lb (1.814 kg)   Body Composition  Body Fat %: 39.2 % Fat Mass (lbs): 83 lbs Muscle Mass (lbs): 122.2 lbs Total Body Water (lbs): 83.8 lbs Visceral Fat Rating : 8   Other Clinical Data Fasting: No Today's Visit #: 6 Starting Date: 07/05/22     HPI  Chief Complaint: OBESITY  Bijou is here to discuss her progress with her obesity treatment plan. She is on the the Category 3 Plan and states she is following her eating plan approximately 65-70 % of the time. She states she is exercising 15-45 minutes 3 days per week.   Interval History:  Since last office visit she has gained 4 pounds.  She went to a bridal shower since her last visit and she is going to [place] soon.  Her best friend is getting married June 2nd.  She is planning her son's birthday party on July 20th. She is also planning her 23th birthday party.  She is under stress and stress eating. She is trying to eat as best as she can.  She has gotten off track. She is trying to make better choices when she eats out.  She is drinking water daily.  She has been walking more.  Her work scheduled changed to 6am-3pm.    She is breast feeding.     Pharmacotherapy for weight loss: She is not currently taking medications  for medical weight loss.  Denies side effects.    Previous pharmacotherapy for medical weight loss: none  Bariatric surgery:  Patient has not had bariatric surgery  Vit D deficiency  She is taking Vit D 50,000 IU weekly.  Denies side effects. Recently saw PCP for follow up.  Denies  nausea, vomiting or muscle weakness.    Lab Results  Component Value Date   VD25OH 44.6 10/03/2022   VD25OH 22.4 (L) 06/13/2022     PHYSICAL EXAM:  Blood pressure 110/74, pulse 71, temperature 98.1 F (36.7 C), height  (1.651 m), weight 211 lb (95.7 kg), SpO2 100 %, not currently breastfeeding. Body mass index is 35.11 kg/m.  General: She is overweight, cooperative, alert, well developed, and in no acute distress. PSYCH: Has normal mood, affect and thought process.   Extremities: No edema.  Neurologic: No gross sensory or motor deficits. No tremors or fasciculations noted.    DIAGNOSTIC DATA REVIEWED:  BMET    Component Value Date/Time   NA 142 07/05/2022 0908   K 4.6 07/05/2022 0908   CL 103 07/05/2022 0908   CO2 24 07/05/2022 0908   GLUCOSE 86 07/05/2022 0908   GLUCOSE 115 (H) 11/04/2021 0920   BUN 10 07/05/2022 0908   CREATININE 0.73 07/05/2022 0908   CALCIUM 9.8 07/05/2022 0908   GFRNONAA >60 02/02/2022 0520   Lab Results  Component Value Date   HGBA1C 5.6 07/05/2022   HGBA1C 5.8 (H) 03/15/2021   Lab Results  Component Value Date   INSULIN 14.2 07/05/2022   INSULIN 56.4 (H) 03/15/2021   Lab Results  Component Value Date   TSH 1.110 07/05/2022  CBC    Component Value Date/Time   WBC 8.7 06/13/2022 0908   WBC 15.2 (H) 02/02/2022 0520   RBC 4.99 06/13/2022 0908   RBC 3.95 02/02/2022 0520   HGB 13.3 06/13/2022 0908   HCT 40.1 06/13/2022 0908   PLT 239 06/13/2022 0908   MCV 80 06/13/2022 0908   MCH 26.7 06/13/2022 0908   MCH 27.1 02/02/2022 0520   MCHC 33.2 06/13/2022 0908   MCHC 34.1 02/02/2022 0520   RDW 15.2 06/13/2022 0908   Iron Studies No results found for: "IRON", "TIBC", "FERRITIN", "IRONPCTSAT" Lipid Panel     Component Value Date/Time   CHOL 206 (H) 07/05/2022 0908   TRIG 80 07/05/2022 0908   HDL 85 07/05/2022 0908   CHOLHDL 3.0 03/15/2021 1233   LDLCALC 107 (H) 07/05/2022 0908   Hepatic Function Panel     Component Value  Date/Time   PROT 7.1 07/05/2022 0908   ALBUMIN 4.6 07/05/2022 0908   AST 18 07/05/2022 0908   ALT 19 07/05/2022 0908   ALKPHOS 100 07/05/2022 0908   BILITOT 0.4 07/05/2022 0908      Component Value Date/Time   TSH 1.110 07/05/2022 0908   Nutritional Lab Results  Component Value Date   VD25OH 44.6 10/03/2022   VD25OH 22.4 (L) 06/13/2022     ASSESSMENT AND PLAN  TREATMENT PLAN FOR OBESITY:  Recommended Dietary Goals  Romona is currently in the action stage of change. As such, her goal is to continue weight management plan. She has agreed to the Category 3 Plan.  Behavioral Intervention  We discussed the following Behavioral Modification Strategies today: increasing lean protein intake, decreasing simple carbohydrates , increasing vegetables, increasing lower glycemic fruits, increasing fiber rich foods, avoiding skipping meals, increasing water intake, emotional eating strategies and understanding the difference between hunger signals and cravings, work on managing stress, creating time for self-care and relaxation measures, continue to practice mindfulness when eating, and planning for success.  Additional resources provided today: NA  Recommended Physical Activity Goals  Remona has been advised to work up to 150 minutes of moderate intensity aerobic activity a week and strengthening exercises 2-3 times per week for cardiovascular health, weight loss maintenance and preservation of muscle mass.   She has agreed to Think about ways to increase physical activity and Work on scheduling and tracking physical activity.    ASSOCIATED CONDITIONS ADDRESSED TODAY  Action/Plan  Vitamin D deficiency Continue to follow up with PCP.    Generalized obesity  BMI 35.0-35.9,adult         Return in about 3 weeks (around 11/22/2022).Marland Kitchen She was informed of the importance of frequent follow up visits to maximize her success with intensive lifestyle modifications for her multiple  health conditions.   ATTESTASTION STATEMENTS:  Reviewed by clinician on day of visit: allergies, medications, problem list, medical history, surgical history, family history, social history, and previous encounter notes.   Time spent on visit including pre-visit chart review and post-visit care and charting was 30 minutes.    Theodis Sato. Essie Lagunes FNP-C

## 2022-11-29 ENCOUNTER — Ambulatory Visit (INDEPENDENT_AMBULATORY_CARE_PROVIDER_SITE_OTHER): Payer: 59 | Admitting: Nurse Practitioner

## 2022-11-29 ENCOUNTER — Encounter: Payer: Self-pay | Admitting: Nurse Practitioner

## 2022-11-29 VITALS — BP 106/69 | HR 74 | Temp 98.1°F | Ht 65.0 in | Wt 212.0 lb

## 2022-11-29 DIAGNOSIS — E559 Vitamin D deficiency, unspecified: Secondary | ICD-10-CM | POA: Diagnosis not present

## 2022-11-29 DIAGNOSIS — E669 Obesity, unspecified: Secondary | ICD-10-CM

## 2022-11-29 DIAGNOSIS — Z6835 Body mass index (BMI) 35.0-35.9, adult: Secondary | ICD-10-CM | POA: Diagnosis not present

## 2022-11-29 NOTE — Progress Notes (Signed)
Office: [phone]  /  Fax: [phone]  WEIGHT SUMMARY AND BIOMETRICS  No data recorded Weight Gained Since Last Visit: 1lb   Vitals Temp: 98.1 F (36.7 C) BP: 106/69 Pulse Rate: 74 SpO2: 100 %   Anthropometric Measurements Height: 5\' 5"  (1.651 m) Weight: 212 lb (96.2 kg) BMI (Calculated): 35.28 Weight at Last Visit: 211lb Weight Gained Since Last Visit: 1lb Starting Weight: 215lb Total Weight Loss (lbs): 3 lb (1.361 kg)   Body Composition  Body Fat %: 36.9 % Fat Mass (lbs): 78.4 lbs Muscle Mass (lbs): 127 lbs Total Body Water (lbs): 84 lbs Visceral Fat Rating : 7   Other Clinical Data Fasting: No Labs: No Today's Visit #: 7 Starting Date: 07/05/22     HPI  Chief Complaint: OBESITY  Gina Frost is here to discuss her progress with her obesity treatment plan. She is on the the Category 3 Plan and states she is following her eating plan approximately 60 % of the time. She states she is exercising 30 minutes 3 days per week.   Interval History:  Since last office visit she has gained 1 pound.  She is struggling with stress and is stress eating. Her best friend changed her wedding date, she is planning her son's birthday party in July and is planning her 32th birthday party.  She states that she has too much on her plate right now and would like to take a break.  Plans to return PRN.    She has stopped breastfeeding  Pharmacotherapy for weight loss: She is not currently taking medications  for medical weight loss.   Vit D deficiency  She is taking Vit D 50,000 IU weekly prescribed by her PCP.  Denies side effects.  Denies nausea, vomiting or muscle weakness.    Lab Results  Component Value Date   VD25OH 44.6 10/03/2022   VD25OH 22.4 (L) 06/13/2022      PHYSICAL EXAM:  Blood pressure 106/69, pulse 74, temperature 98.1 F (36.7 C), height 5\' 5"  (1.651 m), weight 212 lb (96.2 kg), SpO2 100 %, not currently breastfeeding. Body mass index is 35.28  kg/m.  General: She is overweight, cooperative, alert, well developed, and in no acute distress. PSYCH: Has normal mood, affect and thought process.   Extremities: No edema.  Neurologic: No gross sensory or motor deficits. No tremors or fasciculations noted.    DIAGNOSTIC DATA REVIEWED:  BMET    Component Value Date/Time   NA 142 07/05/2022 0908   K 4.6 07/05/2022 0908   CL 103 07/05/2022 0908   CO2 24 07/05/2022 0908   GLUCOSE 86 07/05/2022 0908   GLUCOSE 115 (H) 11/04/2021 0920   BUN 10 07/05/2022 0908   CREATININE 0.73 07/05/2022 0908   CALCIUM 9.8 07/05/2022 0908   GFRNONAA >60 02/02/2022 0520   Lab Results  Component Value Date   HGBA1C 5.6 07/05/2022   HGBA1C 5.8 (H) 03/15/2021   Lab Results  Component Value Date   INSULIN 14.2 07/05/2022   INSULIN 56.4 (H) 03/15/2021   Lab Results  Component Value Date   TSH 1.110 07/05/2022   CBC    Component Value Date/Time   WBC 8.7 06/13/2022 0908   WBC 15.2 (H) 02/02/2022 0520   RBC 4.99 06/13/2022 0908   RBC 3.95 02/02/2022 0520   HGB 13.3 06/13/2022 0908   HCT 40.1 06/13/2022 0908   PLT 239 06/13/2022 0908   MCV 80 06/13/2022 0908   MCH 26.7 06/13/2022 0908   MCH 27.1 02/02/2022  0520   MCHC 33.2 06/13/2022 0908   MCHC 34.1 02/02/2022 0520   RDW 15.2 06/13/2022 0908   Iron Studies No results found for: "IRON", "TIBC", "FERRITIN", "IRONPCTSAT" Lipid Panel     Component Value Date/Time   CHOL 206 (H) 07/05/2022 0908   TRIG 80 07/05/2022 0908   HDL 85 07/05/2022 0908   CHOLHDL 3.0 03/15/2021 1233   LDLCALC 107 (H) 07/05/2022 0908   Hepatic Function Panel     Component Value Date/Time   PROT 7.1 07/05/2022 0908   ALBUMIN 4.6 07/05/2022 0908   AST 18 07/05/2022 0908   ALT 19 07/05/2022 0908   ALKPHOS 100 07/05/2022 0908   BILITOT 0.4 07/05/2022 0908      Component Value Date/Time   TSH 1.110 07/05/2022 0908   Nutritional Lab Results  Component Value Date   VD25OH 44.6 10/03/2022   VD25OH 22.4  (L) 06/13/2022     ASSESSMENT AND PLAN  TREATMENT PLAN FOR OBESITY:  Recommended Dietary Goals  Gina Frost is currently in the action stage of change. As such, her goal is to continue weight management plan. She has agreed to the Category 2 Plan.  Behavioral Intervention  We discussed the following Behavioral Modification Strategies today: increasing lean protein intake, decreasing simple carbohydrates , increasing vegetables, increasing lower glycemic fruits, increasing fiber rich foods, avoiding skipping meals, increasing water intake, continue to practice mindfulness when eating, and planning for success.  Additional resources provided today: NA  Recommended Physical Activity Goals  Gina Frost has been advised to work up to 150 minutes of moderate intensity aerobic activity a week and strengthening exercises 2-3 times per week for cardiovascular health, weight loss maintenance and preservation of muscle mass.   She has agreed to Continue current level of physical activity  and Think about ways to increase daily physical activity and overcoming barriers to exercise   ASSOCIATED CONDITIONS ADDRESSED TODAY  Action/Plan  Vitamin D deficiency Continue to follow up with PCP.    Generalized obesity  BMI 35.0-35.9,adult      To request labs at the end of the year.     Return if symptoms worsen or fail to improve.Marland Kitchen She was informed of the importance of frequent follow up visits to maximize her success with intensive lifestyle modifications for her multiple health conditions.   ATTESTASTION STATEMENTS:  Reviewed by clinician on day of visit: allergies, medications, problem list, medical history, surgical history, family history, social history, and previous encounter notes.   Time spent on visit including pre-visit chart review and post-visit care and charting was 30 minutes.    Theodis Sato. Shanta Dorvil FNP-C

## 2023-04-08 ENCOUNTER — Ambulatory Visit: Payer: 59 | Admitting: Nurse Practitioner

## 2023-04-08 VITALS — BP 124/78 | HR 76 | Temp 98.8°F | Ht 65.0 in | Wt 221.2 lb

## 2023-04-08 DIAGNOSIS — F53 Postpartum depression: Secondary | ICD-10-CM

## 2023-04-08 DIAGNOSIS — E7800 Pure hypercholesterolemia, unspecified: Secondary | ICD-10-CM | POA: Diagnosis not present

## 2023-04-08 DIAGNOSIS — E559 Vitamin D deficiency, unspecified: Secondary | ICD-10-CM

## 2023-04-08 DIAGNOSIS — R7309 Other abnormal glucose: Secondary | ICD-10-CM | POA: Diagnosis not present

## 2023-04-08 DIAGNOSIS — Z23 Encounter for immunization: Secondary | ICD-10-CM | POA: Diagnosis not present

## 2023-04-08 DIAGNOSIS — Z6836 Body mass index (BMI) 36.0-36.9, adult: Secondary | ICD-10-CM

## 2023-04-08 DIAGNOSIS — F32A Depression, unspecified: Secondary | ICD-10-CM | POA: Diagnosis not present

## 2023-04-08 DIAGNOSIS — F3341 Major depressive disorder, recurrent, in partial remission: Secondary | ICD-10-CM | POA: Insufficient documentation

## 2023-04-08 DIAGNOSIS — E6609 Other obesity due to excess calories: Secondary | ICD-10-CM

## 2023-04-08 DIAGNOSIS — R5383 Other fatigue: Secondary | ICD-10-CM

## 2023-04-08 HISTORY — DX: Postpartum depression: F53.0

## 2023-04-08 LAB — LIPID PANEL
Chol/HDL Ratio: 3.1 ratio (ref 0.0–4.4)
Cholesterol, Total: 205 mg/dL — ABNORMAL HIGH (ref 100–199)
HDL: 66 mg/dL (ref >39)
LDL Chol Calc (NIH): 119 mg/dL — ABNORMAL HIGH (ref 0–99)
Triglycerides: 116 mg/dL (ref 0–149)
VLDL Cholesterol Cal: 20 mg/dL (ref 5–40)

## 2023-04-08 LAB — CMP14+EGFR
ALT: 14 IU/L (ref 0–32)
AST: 12 IU/L (ref 0–40)
Albumin: 4.7 g/dL (ref 4.0–5.0)
Alkaline Phosphatase: 88 IU/L (ref 44–121)
BUN/Creatinine Ratio: 12 (ref 9–23)
BUN: 9 mg/dL (ref 6–20)
Bilirubin Total: 0.4 mg/dL (ref 0.0–1.2)
CO2: 22 mmol/L (ref 20–29)
Calcium: 9.7 mg/dL (ref 8.7–10.2)
Chloride: 102 mmol/L (ref 96–106)
Creatinine, Ser: 0.73 mg/dL (ref 0.57–1.00)
Globulin, Total: 2.3 g/dL (ref 1.5–4.5)
Glucose: 94 mg/dL (ref 70–99)
Potassium: 4.4 mmol/L (ref 3.5–5.2)
Sodium: 140 mmol/L (ref 134–144)
Total Protein: 7 g/dL (ref 6.0–8.5)
eGFR: 114 mL/min/{1.73_m2} (ref >59)

## 2023-04-08 LAB — HEMOGLOBIN A1C
Est. average glucose Bld gHb Est-mCnc: 117 mg/dL
Hgb A1c MFr Bld: 5.7 % — ABNORMAL HIGH (ref 4.8–5.6)

## 2023-04-08 MED ORDER — BUPROPION HCL ER (XL) 150 MG PO TB24: 150.0000 mg | ORAL_TABLET | ORAL | 2 refills | Status: DC

## 2023-04-08 NOTE — Patient Instructions (Signed)
Be aware of any changes in your mood that are concerning and another side effect to monitor for is seizures, dry mouth with wellbutrin.  Goal to exercise 150 minutes per week with at least 2 days of strength training Encouraged to park further when at the store, take stairs instead of elevators and to walk in place during commercials. Increase water intake to at least one gallon of water daily.

## 2023-04-08 NOTE — Assessment & Plan Note (Signed)
All of her metabolic labs were normal at last check.  This may be related to her depression.

## 2023-04-08 NOTE — Assessment & Plan Note (Signed)
She has gained 12 pounds since her last visit.  She is interested in weight loss.  She will be started on Wellbutrin for her depression which may help with her to start back to exercising regularly.

## 2023-04-08 NOTE — Assessment & Plan Note (Signed)
Will check vitamin D level and supplement as needed.    Also encouraged to spend 15 minutes in the sun daily.

## 2023-04-08 NOTE — Assessment & Plan Note (Signed)
Depression screen score 16 up from 13 will start her on wellbutrin. Discussed side effects.

## 2023-04-08 NOTE — Assessment & Plan Note (Signed)
Cholesterol levels were stable.  Will check lipid panel.

## 2023-04-08 NOTE — Progress Notes (Signed)
I,Jameka J Llittleton, CMA,acting as a Neurosurgeon for [employer], FNP.,have documented all relevant documentation on the behalf of Arnette Felts, FNP,as directed by  Arnette Felts, FNP while in the presence of Arnette Felts, FNP.  Subjective:  Patient ID: Gina Frost , female    DOB: [birth year] , 32 y.o.   MRN: [id number]  Chief Complaint  Patient presents with   vitamin D recheck   postpartum depression f/u    HPI  Patient presents today for a 6 month f/u on her vitamin D and postpartum depression. Patient reports she hasn't taken aymore vitamin D since May or June. She reports she is feeling better from the postpartum depression she is going once a month now.   Wt Readings from Last 3 Encounters: 04/08/23 : 221 lb 3.2 oz (100.3 kg) 11/29/22 : 212 lb (96.2 kg) 11/01/22 : 211 lb (95.7 kg)  She is no longer breast feeding. She has not taken phentermine in the past. She is interested weight loss. She has gained approximately 12 lbs since her last visit. She does find that she is an emotional eater.       Past Medical History:  Diagnosis Date   Anxiety    Depression    Eczema    Eczema    Multiple food allergies    Vitamin D deficiency      Family History  Problem Relation Age of Onset   Stroke Mother    Hypertension Mother    Kidney cancer Mother    Hyperlipidemia Father    Alcoholism Father    Lupus Sister    Eczema Sister    Diabetes Maternal Grandmother    Allergic rhinitis Neg Hx    Angioedema Neg Hx    Asthma Neg Hx    Atopy Neg Hx    Immunodeficiency Neg Hx    Urticaria Neg Hx      Current Outpatient Medications:    buPROPion (WELLBUTRIN XL) 150 MG 24 hr tablet, Take 1 tablet (150 mg total) by mouth every morning., Disp: 30 tablet, Rfl: 2   EPINEPHrine 0.3 mg/0.3 mL IJ SOAJ injection, Inject into the muscle., Disp: , Rfl:    EUCRISA 2 % OINT, , Disp: , Rfl:    Vitamin D, Ergocalciferol, (DRISDOL) 1.25 MG (50000 UNIT) CAPS capsule, Take 1 capsule (50,000  Units total) by mouth every 7 (seven) days., Disp: 12 capsule, Rfl: 1   Allergies  Allergen Reactions   Shellfish Allergy Anaphylaxis     Review of Systems  Constitutional: Negative.   Eyes: Negative.   Respiratory: Negative.    Cardiovascular: Negative.   Musculoskeletal: Negative.   Skin: Negative.   Neurological: Negative.   Psychiatric/Behavioral: Negative.       Today's Vitals   04/08/23 0810  BP: 124/78  Pulse: 76  Temp: 98.8 F (37.1 C)  Weight: 221 lb 3.2 oz (100.3 kg)  Height: 5\' 5"  (1.651 m)  PainSc: 0-No pain   Body mass index is 36.81 kg/m.  Wt Readings from Last 3 Encounters:  04/08/23 221 lb 3.2 oz (100.3 kg)  11/29/22 212 lb (96.2 kg)  11/01/22 211 lb (95.7 kg)      Objective:  Physical Exam Vitals reviewed.  Constitutional:      General: She is not in acute distress.    Appearance: Normal appearance.  Cardiovascular:     Rate and Rhythm: Normal rate and regular rhythm.     Pulses: Normal pulses.     Heart sounds: Normal heart  sounds. No murmur heard. Pulmonary:     Effort: Pulmonary effort is normal. No respiratory distress.     Breath sounds: Normal breath sounds. No wheezing.  Skin:    General: Skin is warm and dry.     Capillary Refill: Capillary refill takes less than 2 seconds.  Neurological:     General: No focal deficit present.     Mental Status: She is alert and oriented to person, place, and time.     Cranial Nerves: No cranial nerve deficit.     Motor: No weakness.  Psychiatric:        Mood and Affect: Mood normal.        Behavior: Behavior normal.        Thought Content: Thought content normal.        Judgment: Judgment normal.         Assessment And Plan:  Vitamin D deficiency Assessment & Plan: Will check vitamin D level and supplement as needed.    Also encouraged to spend 15 minutes in the sun daily.     Need for influenza vaccination Assessment & Plan: Influenza vaccine administered Encouraged to take  Tylenol as needed for fever or muscle aches.   Orders: -     Flu vaccine trivalent PF, 6mos and older(Flulaval,Afluria,Fluarix,Fluzone)  Abnormal glucose Assessment & Plan: HgbA1c is stable. Will recheck today. Continue focusing on diet low in sugar and starches.   Orders: -     Hemoglobin A1c  Elevated cholesterol Assessment & Plan: Cholesterol levels were stable.  Will check lipid panel.  Orders: -     Lipid panel  Fatigue due to depression Assessment & Plan: All of her metabolic labs were normal at last check.  This may be related to her depression.   Recurrent major depressive disorder, in partial remission [facility]) Assessment & Plan: Depression screen score 16 up from 13 will start her on wellbutrin. Discussed side effects.   Orders: -     buPROPion HCl ER (XL); Take 1 tablet (150 mg total) by mouth every morning.  Dispense: 30 tablet; Refill: 2 -     [id number]+EGFR  Class 2 obesity due to excess calories without serious comorbidity with body mass index (BMI) of 36.0 to 36.9 in adult Assessment & Plan: She has gained 12 pounds since her last visit.  She is interested in weight loss.  She will be started on Wellbutrin for her depression which may help with her to start back to exercising regularly.     Return in about 3 months (around 07/08/2023) for physical.  Patient was given opportunity to ask questions. Patient verbalized understanding of the plan and was able to repeat key elements of the plan. All questions were answered to their satisfaction.    Jeanell Sparrow, FNP, have reviewed all documentation for this visit. The documentation on 04/08/23 for the exam, diagnosis, procedures, and orders are all accurate and complete.   IF YOU HAVE BEEN REFERRED TO A SPECIALIST, IT MAY TAKE 1-2 WEEKS TO SCHEDULE/PROCESS THE REFERRAL. IF YOU HAVE NOT HEARD FROM US/SPECIALIST IN TWO WEEKS, PLEASE GIVE [place] A CALL AT [phone].

## 2023-04-08 NOTE — Assessment & Plan Note (Signed)
Influenza vaccine administered Encouraged to take Tylenol as needed for fever or muscle aches.

## 2023-04-08 NOTE — Assessment & Plan Note (Signed)
HgbA1c is stable. Will recheck today. Continue focusing on diet low in sugar and starches.

## 2023-06-25 ENCOUNTER — Encounter: Payer: 59 | Admitting: Nurse Practitioner

## 2023-08-01 DIAGNOSIS — M2042 Other hammer toe(s) (acquired), left foot: Secondary | ICD-10-CM | POA: Diagnosis not present

## 2023-08-01 DIAGNOSIS — M2041 Other hammer toe(s) (acquired), right foot: Secondary | ICD-10-CM | POA: Diagnosis not present

## 2023-08-15 ENCOUNTER — Encounter: Payer: BC Managed Care – PPO | Admitting: Nurse Practitioner

## 2023-09-08 IMAGING — US US AXILLARY LEFT
1 series · 13 of 16 positions shown · non-contrast
Comparison: None.

CLINICAL DATA: Patient presents with a palpable left breast lump,
which she has noticed since [REDACTED], and which has increased in size.
It is mildly tender to palpation, but otherwise is not painful.
There has been no drainage from the lesion or overlying skin
discoloration.

EXAM:
ULTRASOUND OF THE LEFT AXILLA

[Series 1: us axillary left · 0.06mm/px · 16 acquisitions, 13 frames shown]
[im 1/16]
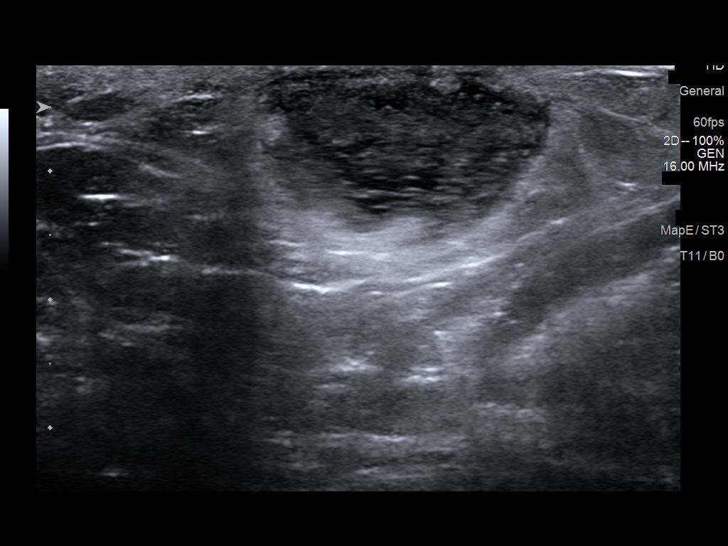
[im 2/16]
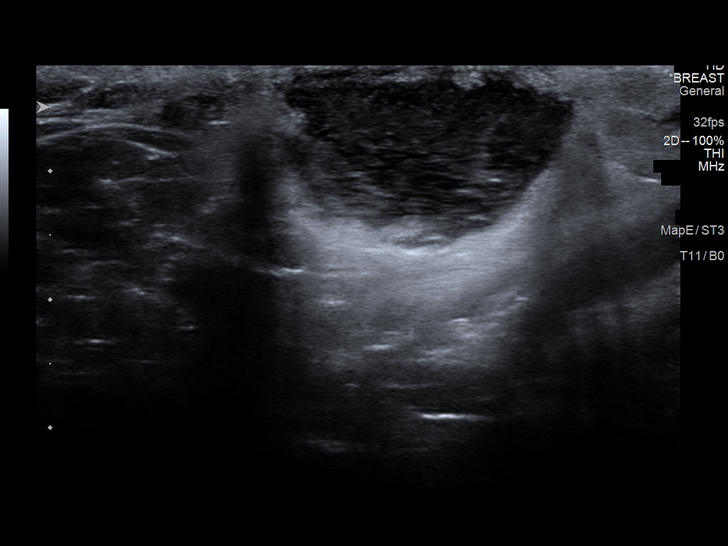
[im 4/16]
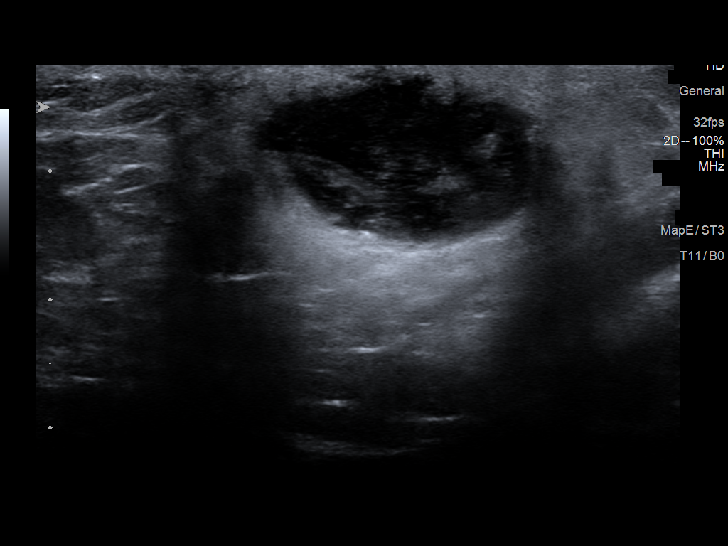
[im 5/16]
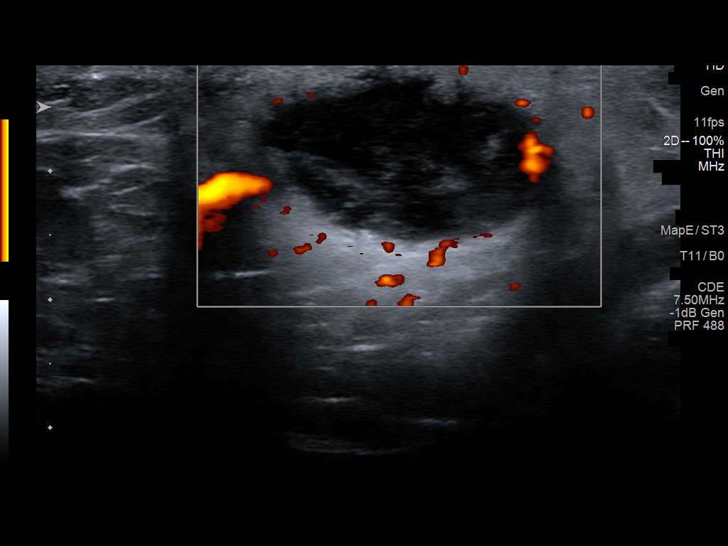
[im 6/16]
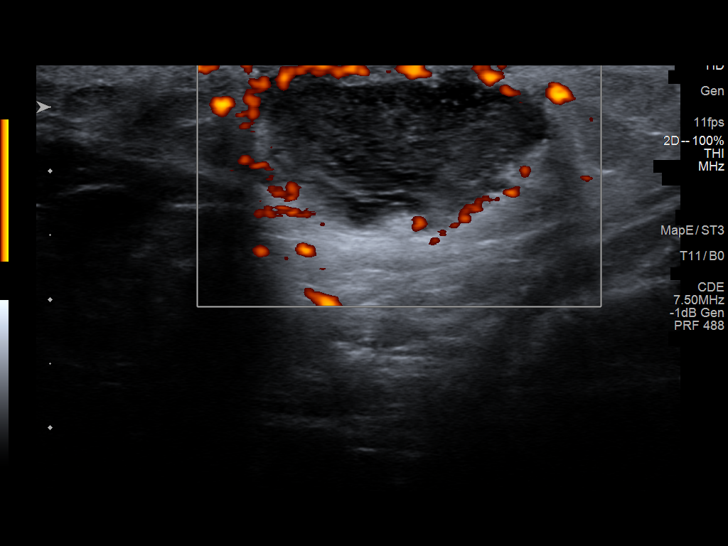
[im 7/16]
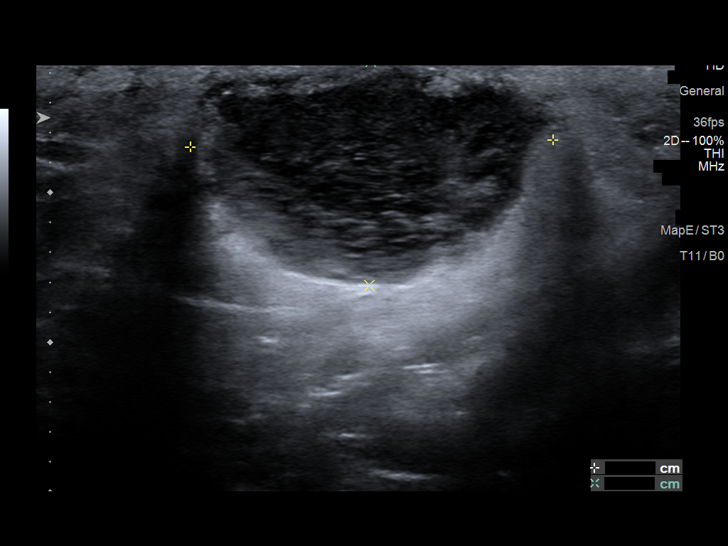
[im 9/16]
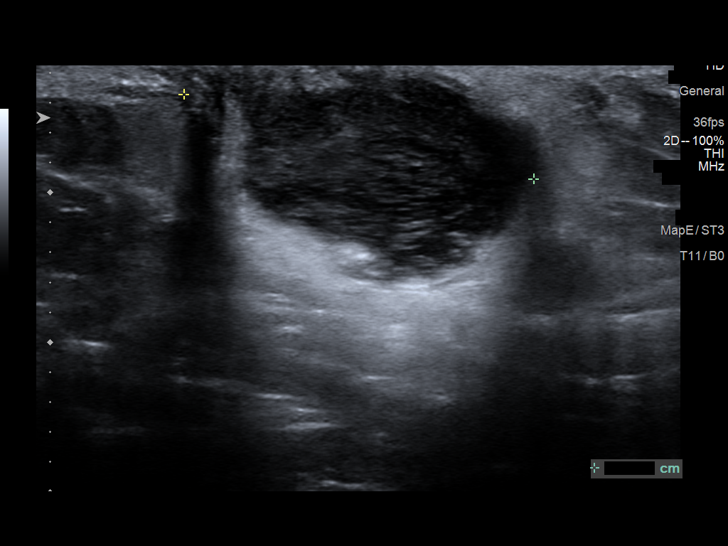
[im 10/16]
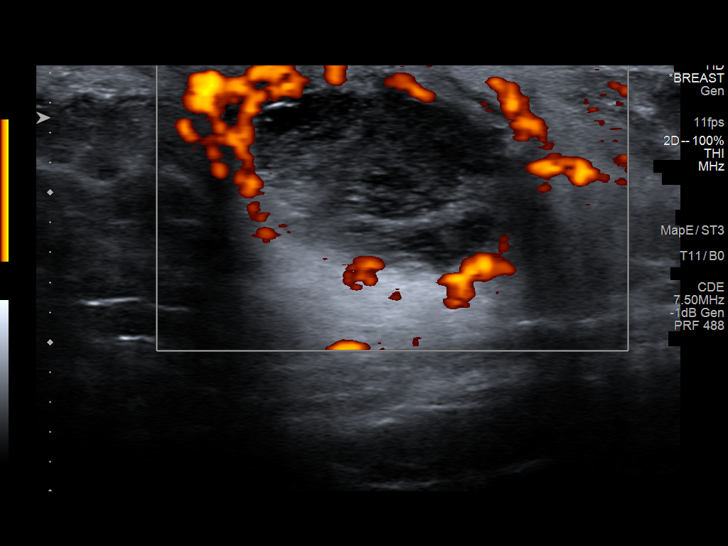
[im 11/16]
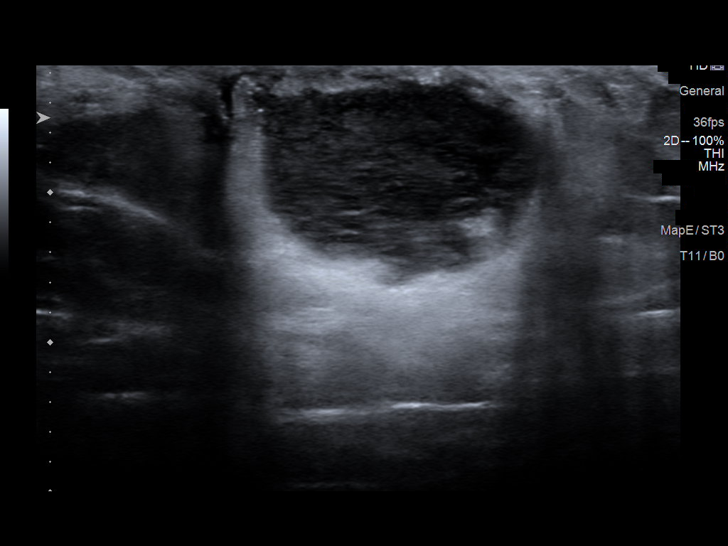
[im 12/16]
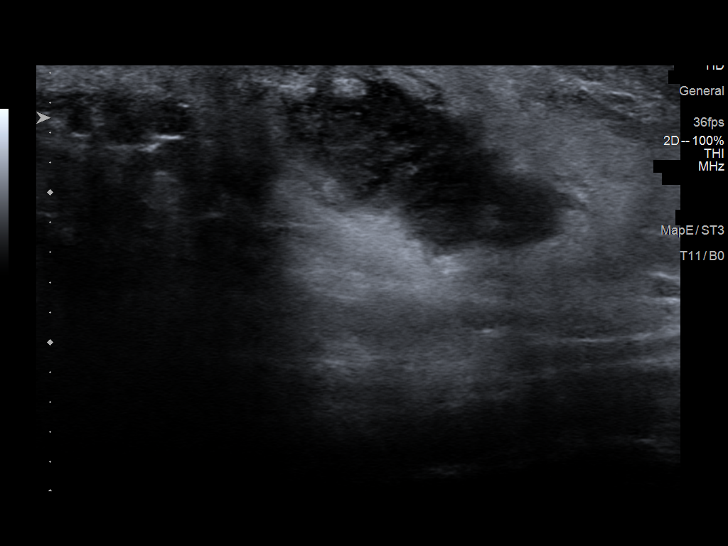
[im 13/16]
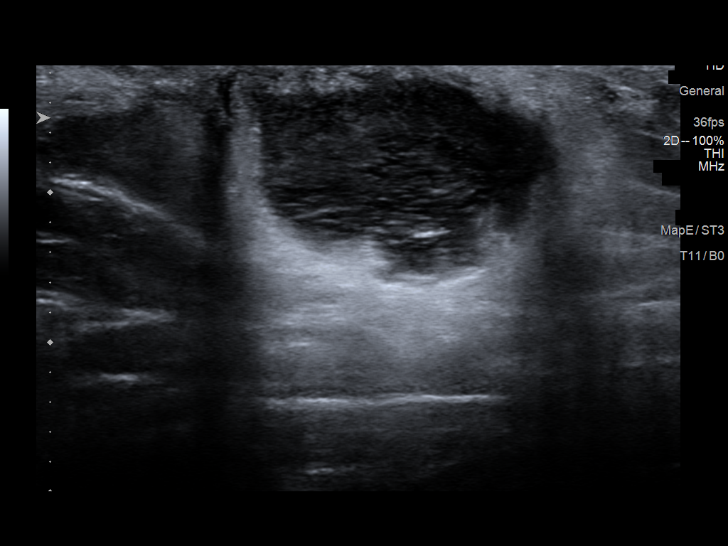
[im 15/16]
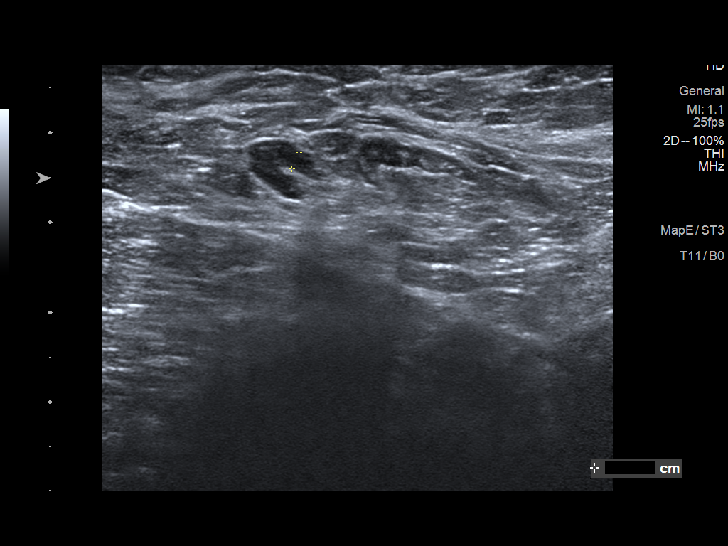
[im 16/16]
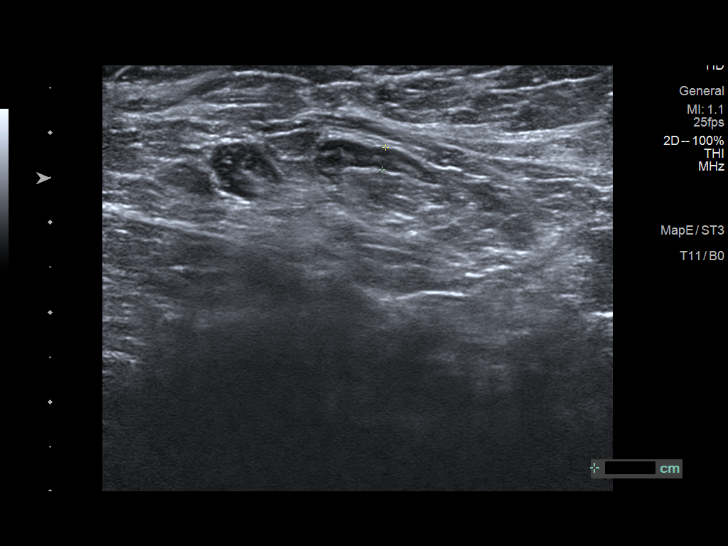

[13 of 16 positions shown; findings below may reference images not displayed]

FINDINGS: On physical exam,there is a superficial smooth mass in the left
axilla, mildly tender to palpation.

Ultrasound is performed, showing a heterogeneous hypoechoic mass
extending from the deep dermis into the underlying subcutaneous fat.
Margins are irregular and partly ill-defined. The mass measures
x 1.5 x 2.4 cm. There is peripheral blood flow, but no internal
blood flow on color Doppler analysis. There is also increased
through transmission of the sound beam. There are no enlarged or
abnormal lymph nodes.
IMPRESSION: 1. Left axillary mass that is consistent with an inflamed epidermal
inclusion cyst. However, this has developed recently and has
increased in size over the last month. There is also no sonographic
skin tract and there has been no clinical evidence of skin drainage.
Tissue sampling is recommended.

RECOMMENDATION:
1. Ultrasound-guided core needle biopsy the left axillary mass.

I have discussed the findings and recommendations with the patient.
If applicable, a reminder letter will be sent to the patient
regarding the next appointment.

BI-RADS CATEGORY  4: Suspicious.

## 2023-09-13 DIAGNOSIS — M2041 Other hammer toe(s) (acquired), right foot: Secondary | ICD-10-CM | POA: Diagnosis not present

## 2023-09-13 DIAGNOSIS — M79672 Pain in left foot: Secondary | ICD-10-CM | POA: Diagnosis not present

## 2023-09-13 DIAGNOSIS — M79671 Pain in right foot: Secondary | ICD-10-CM | POA: Diagnosis not present

## 2023-09-13 DIAGNOSIS — M205X1 Other deformities of toe(s) (acquired), right foot: Secondary | ICD-10-CM | POA: Diagnosis not present

## 2023-09-13 DIAGNOSIS — M205X2 Other deformities of toe(s) (acquired), left foot: Secondary | ICD-10-CM | POA: Diagnosis not present

## 2023-10-23 ENCOUNTER — Encounter: Payer: Self-pay | Admitting: Nurse Practitioner

## 2023-10-23 ENCOUNTER — Ambulatory Visit: Payer: BC Managed Care – PPO | Admitting: Nurse Practitioner

## 2023-10-23 VITALS — BP 120/60 | HR 66 | Temp 98.6°F | Ht 65.0 in | Wt 228.0 lb

## 2023-10-23 DIAGNOSIS — R5383 Other fatigue: Secondary | ICD-10-CM

## 2023-10-23 DIAGNOSIS — E7800 Pure hypercholesterolemia, unspecified: Secondary | ICD-10-CM

## 2023-10-23 DIAGNOSIS — R7309 Other abnormal glucose: Secondary | ICD-10-CM | POA: Diagnosis not present

## 2023-10-23 DIAGNOSIS — E559 Vitamin D deficiency, unspecified: Secondary | ICD-10-CM | POA: Diagnosis not present

## 2023-10-23 DIAGNOSIS — Z0000 Encounter for general adult medical examination without abnormal findings: Secondary | ICD-10-CM

## 2023-10-23 NOTE — Patient Instructions (Signed)
 Health Maintenance  Topic Date Due   Flu Shot  02/14/2024   Pap with HPV screening  03/08/2024   DTaP/Tdap/Td vaccine (3 - Td or Tdap) 11/18/2031   Hepatitis C Screening  Completed   HIV Screening  Completed   HPV Vaccine  Aged Out   COVID-19 Vaccine  Discontinued   Goal to exercise 150 minutes per week with at least 2 days of strength training Encouraged to park further when at the store, take stairs instead of elevators and to walk in place during commercials. Increase water intake to at least one gallon of water daily.

## 2023-10-23 NOTE — Progress Notes (Unsigned)
 [place], CMA,acting as a Neurosurgeon for Susanna Epley, FNP.,have documented all relevant documentation on the behalf of Susanna Epley, FNP,as directed by  Susanna Epley, FNP while in the presence of Susanna Epley, FNP.  Subjective:    Patient ID: Gina Frost , female    DOB: Apr 21, 1991 , 33 y.o.   MRN: [id number]  Chief Complaint  Patient presents with   Annual Exam    HPI  Patient presents today for HM, Patient reports compliance with medication. Patient denies any chest pain, SOB, or headaches. Patient has no concerns today. She is scheduled to see Dermatology for her routine care and dental cleaning.  She is seeing Dr. Derwood Flor ([facility]).         Past Medical History:  Diagnosis Date   Allergy 07/16/2018   Anxiety    Depression    Eczema    Eczema    Multiple food allergies    Vitamin D deficiency      Family History  Problem Relation Age of Onset   Stroke Mother    Hypertension Mother    Kidney cancer Mother    Hyperlipidemia Father    Alcoholism Father    Lupus Sister    Eczema Sister    Diabetes Maternal Grandmother    Depression Sister    Depression Sister    Allergic rhinitis Neg Hx    Angioedema Neg Hx    Asthma Neg Hx    Atopy Neg Hx    Immunodeficiency Neg Hx    Urticaria Neg Hx      Current Outpatient Medications:    EPINEPHrine 0.3 mg/0.3 mL IJ SOAJ injection, Inject into the muscle., Disp: , Rfl:    EUCRISA 2 % OINT, , Disp: , Rfl:    Vitamin D, Ergocalciferol, (DRISDOL) 1.25 MG (50000 UNIT) CAPS capsule, Take 1 capsule (50,000 Units total) by mouth every 7 (seven) days. (Patient not taking: Reported on 10/23/2023), Disp: 12 capsule, Rfl: 1   Allergies  Allergen Reactions   Shellfish Allergy Anaphylaxis      The patient states she uses none for birth control. Patient's last menstrual period was 10/17/2023 (approximate).. Negative for Dysmenorrhea and Negative for Menorrhagia. Negative for: breast discharge, breast lump(s), breast  pain and breast self exam. Associated symptoms include abnormal vaginal bleeding. Pertinent negatives include abnormal bleeding (hematology), anxiety, decreased libido, depression, difficulty falling sleep, dyspareunia, history of infertility, nocturia, sexual dysfunction, sleep disturbances, urinary incontinence, urinary urgency, vaginal discharge and vaginal itching. Diet regular; she does not eat much some times. The patient states her exercise level is minimal - difficult to find time.   The patient's tobacco use is:  Social History   Tobacco Use  Smoking Status Never  Smokeless Tobacco Never   She has been exposed to passive smoke. The patient's alcohol use is:  Social History   Substance and Sexual Activity  Alcohol Use Yes   Alcohol/week: 1.0 standard drink of alcohol   Types: 1 Standard drinks or equivalent per week   Comment: not while preg   Additional information: Last pap 04/08/2021, next one scheduled for 04/08/2024.    Review of Systems  Constitutional: Negative.   HENT: Negative.    Eyes: Negative.   Respiratory: Negative.    Cardiovascular: Negative.   Gastrointestinal: Negative.   Endocrine: Negative.   Genitourinary: Negative.   Musculoskeletal: Negative.   Skin: Negative.   Allergic/Immunologic: Negative.   Neurological: Negative.   Hematological: Negative.   Psychiatric/Behavioral: Negative.  Today's Vitals   10/23/23 0953  BP: 120/60  Pulse: 66  Temp: 98.6 F (37 C)  TempSrc: Oral  Weight: 228 lb (103.4 kg)  Height: 5\' 5"  (1.651 m)  PainSc: 0-No pain   Body mass index is 37.94 kg/m.  Wt Readings from Last 3 Encounters:  10/23/23 228 lb (103.4 kg)  04/08/23 221 lb 3.2 oz (100.3 kg)  11/29/22 212 lb (96.2 kg)     Objective:  Physical Exam Vitals reviewed.  Constitutional:      General: She is not in acute distress.    Appearance: Normal appearance. She is well-developed. She is obese.  HENT:     Head: Normocephalic and atraumatic.      Right Ear: Hearing, tympanic membrane, ear canal and external ear normal. There is no impacted cerumen.     Left Ear: Hearing, tympanic membrane, ear canal and external ear normal. There is no impacted cerumen.     Nose: Nose normal.     Mouth/Throat:     Mouth: Mucous membranes are moist.  Eyes:     General: Lids are normal.     Extraocular Movements: Extraocular movements intact.     Conjunctiva/sclera: Conjunctivae normal.     Pupils: Pupils are equal, round, and reactive to light.     Funduscopic exam:    Right eye: No papilledema.        Left eye: No papilledema.  Neck:     Thyroid: No thyroid mass.     Vascular: No carotid bruit.  Cardiovascular:     Rate and Rhythm: Normal rate and regular rhythm.     Pulses: Normal pulses.     Heart sounds: Normal heart sounds. No murmur heard. Pulmonary:     Effort: Pulmonary effort is normal. No respiratory distress.     Breath sounds: Normal breath sounds. No wheezing.  Chest:     Chest wall: No mass.  Breasts:    Tanner Score is 5.     Right: Normal. No mass or tenderness.     Left: Normal. No mass or tenderness.  Abdominal:     General: Abdomen is flat. Bowel sounds are normal. There is no distension.     Palpations: Abdomen is soft.     Tenderness: There is no abdominal tenderness.  Genitourinary:    Comments: Followed by GYN Musculoskeletal:        General: No swelling or tenderness. Normal range of motion.     Cervical back: Full passive range of motion without pain, normal range of motion and neck supple.     Right lower leg: No edema.     Left lower leg: No edema.  Lymphadenopathy:     Upper Body:     Right upper body: No supraclavicular, axillary or pectoral adenopathy.     Left upper body: No supraclavicular, axillary or pectoral adenopathy.  Skin:    General: Skin is warm and dry.     Capillary Refill: Capillary refill takes less than 2 seconds.  Neurological:     General: No focal deficit present.     Mental  Status: She is alert and oriented to person, place, and time.     Cranial Nerves: No cranial nerve deficit.     Sensory: No sensory deficit.     Motor: No weakness.  Psychiatric:        Mood and Affect: Mood normal.        Behavior: Behavior normal.        Thought  Content: Thought content normal.        Judgment: Judgment normal.        10/23/2023   10:02 AM 04/08/2023    9:10 AM 10/03/2022    8:21 AM 06/13/2022    8:29 AM 01/11/2022   10:32 AM  Depression screen PHQ 2/9  Decreased Interest 0 1 1 0 0  Down, Depressed, Hopeless 0 2 2 0 0  PHQ - 2 Score 0 3 3 0 0  Altered sleeping 2 3 3  1   Tired, decreased energy 1 3 3   0  Change in appetite 1 2 0  0  Feeling bad or failure about yourself  0 2 3  0  Trouble concentrating 0 3 0  0  Moving slowly or fidgety/restless 0 0 0  0  Suicidal thoughts 0 0 1  0  PHQ-9 Score 4 16 13  1   Difficult doing work/chores Not difficult at all Somewhat difficult Somewhat difficult        10/23/2023   10:03 AM 04/08/2023    9:10 AM 10/03/2022    8:24 AM 01/11/2022   10:32 AM  GAD 7 : Generalized Anxiety Score  Nervous, Anxious, on Edge 1 2 3  0  Control/stop worrying 0 2 1 0  Worry too much - different things 0 2 1 1   Trouble relaxing 1 1 1 1   Restless 1 0 1 0  Easily annoyed or irritable 1 2 1  0  Afraid - awful might happen 0 0 0 0  Total GAD 7 Score 4 9 8 2   Anxiety Difficulty Not difficult at all Somewhat difficult Not difficult at all       Assessment And Plan:     Encounter for annual health examination -     CBC with Differential/Platelet -     CMP14+EGFR  Vitamin D deficiency -     VITAMIN D 25 Hydroxy (Vit-D Deficiency, Fractures)  Elevated cholesterol -     Lipid panel  Abnormal glucose -     Hemoglobin A1c  Other fatigue -     TSH -     Vitamin B12    Return for 1 year physical, 39m pre DM. Patient was given opportunity to ask questions. Patient verbalized understanding of the plan and was able to repeat key elements  of the plan. All questions were answered to their satisfaction.   Susanna Epley, FNP  I, Susanna Epley, FNP, have reviewed all documentation for this visit. The documentation on 10/30/23 for the exam, diagnosis, procedures, and orders are all accurate and complete.

## 2023-10-24 LAB — CBC WITH DIFFERENTIAL/PLATELET
Basophils Absolute: 0 10*3/uL (ref 0.0–0.2)
Basos: 1 %
EOS (ABSOLUTE): 0.2 10*3/uL (ref 0.0–0.4)
Eos: 3 %
Hematocrit: 39.8 % (ref 34.0–46.6)
Hemoglobin: 13.2 g/dL (ref 11.1–15.9)
Immature Grans (Abs): 0 10*3/uL (ref 0.0–0.1)
Immature Granulocytes: 0 %
Lymphocytes Absolute: 1.9 10*3/uL (ref 0.7–3.1)
Lymphs: 27 %
MCH: 28.4 pg (ref 26.6–33.0)
MCHC: 33.2 g/dL (ref 31.5–35.7)
MCV: 86 fL (ref 79–97)
Monocytes Absolute: 0.6 10*3/uL (ref 0.1–0.9)
Monocytes: 8 %
Neutrophils Absolute: 4.6 10*3/uL (ref 1.4–7.0)
Neutrophils: 61 %
Platelets: 245 10*3/uL (ref 150–450)
RBC: 4.65 x10E6/uL (ref 3.77–5.28)
RDW: 13.8 % (ref 11.7–15.4)
WBC: 7.3 10*3/uL (ref 3.4–10.8)

## 2023-10-24 LAB — LIPID PANEL
Chol/HDL Ratio: 3 ratio (ref 0.0–4.4)
Cholesterol, Total: 198 mg/dL (ref 100–199)
HDL: 67 mg/dL (ref >39)
LDL Chol Calc (NIH): 108 mg/dL — ABNORMAL HIGH (ref 0–99)
Triglycerides: 135 mg/dL (ref 0–149)
VLDL Cholesterol Cal: 23 mg/dL (ref 5–40)

## 2023-10-24 LAB — HEMOGLOBIN A1C
Est. average glucose Bld gHb Est-mCnc: 120 mg/dL
Hgb A1c MFr Bld: 5.8 % — ABNORMAL HIGH (ref 4.8–5.6)

## 2023-10-24 LAB — CMP14+EGFR
ALT: 10 IU/L (ref 0–32)
AST: 17 IU/L (ref 0–40)
Albumin: 4.5 g/dL (ref 4.0–5.0)
Alkaline Phosphatase: 94 IU/L (ref 44–121)
BUN/Creatinine Ratio: 12 (ref 9–23)
BUN: 10 mg/dL (ref 6–20)
Bilirubin Total: 0.3 mg/dL (ref 0.0–1.2)
CO2: 27 mmol/L (ref 20–29)
Calcium: 9.7 mg/dL (ref 8.7–10.2)
Chloride: 101 mmol/L (ref 96–106)
Creatinine, Ser: 0.84 mg/dL (ref 0.57–1.00)
Globulin, Total: 2.7 g/dL (ref 1.5–4.5)
Glucose: 83 mg/dL (ref 70–99)
Potassium: 4.2 mmol/L (ref 3.5–5.2)
Sodium: 139 mmol/L (ref 134–144)
Total Protein: 7.2 g/dL (ref 6.0–8.5)
eGFR: 96 mL/min/{1.73_m2} (ref >59)

## 2023-10-24 LAB — VITAMIN D 25 HYDROXY (VIT D DEFICIENCY, FRACTURES): Vit D, 25-Hydroxy: 22.3 ng/mL — ABNORMAL LOW (ref 30.0–100.0)

## 2023-10-24 LAB — VITAMIN B12: Vitamin B-12: 525 pg/mL (ref 232–1245)

## 2023-10-24 LAB — TSH: TSH: 0.9 u[IU]/mL (ref 0.450–4.500)

## 2023-10-30 DIAGNOSIS — Z0000 Encounter for general adult medical examination without abnormal findings: Secondary | ICD-10-CM | POA: Insufficient documentation

## 2023-10-30 DIAGNOSIS — R5383 Other fatigue: Secondary | ICD-10-CM | POA: Insufficient documentation

## 2023-10-30 DIAGNOSIS — L219 Seborrheic dermatitis, unspecified: Secondary | ICD-10-CM | POA: Diagnosis not present

## 2023-10-30 DIAGNOSIS — L2084 Intrinsic (allergic) eczema: Secondary | ICD-10-CM | POA: Diagnosis not present

## 2023-10-30 DIAGNOSIS — L658 Other specified nonscarring hair loss: Secondary | ICD-10-CM | POA: Diagnosis not present

## 2023-10-30 NOTE — Progress Notes (Signed)
 History of Present Illness The patient is a 33 year old female presenting with a couple of months of dry, flaky scalp.  The condition began after she cut her long dreadlocks in 06/2023. Since then, fine flakes, dryness, and itchiness have been noticed on her scalp. It is unclear if this has been an ongoing issue or if she has become more aware of it due to experimenting with new hairstyles. Hair is washed once a week or every other week. Current treatment includes the use of 100% castor oil. She has also tried CeraVe 2-in-1, which she found beneficial for her child's eczema, and observed a difference when using this product compared to when she does not.  A history of atopic dermatitis is reported, which is currently well-managed with her regimen. Satisfaction with the treatment is expressed. Triamcinolone is used as needed, approximately once a week, with increased use during the summer and spring when the condition tends to worsen. Eucrisa is applied every other day. Additionally, Vaseline, Vanicream, and homemade shea butter made by her husband are used.   Physical Exam BP (!) 108/51 (BP Location: Right arm, Patient Position: Sitting)   Pulse 75   Ht 1.626 m (5' 4)   Wt 103 kg (228 lb)   BMI 39.14 kg/m   General Appearance: Well-nourished, well-developed, no acute distress Hair: Diffuse scaling noted on the scalp. Mild thinning of hair along the frontal hair line bilaterally.       Assessment & Plan 1. Seborrheic Dermatitis. - Prescribe clobetasol foam, to be applied every other day for 2 weeks, then 2 to 3 times per week as needed. - Advise discontinuation of castor oil on the scalp. - Contact the clinic if the foam causes irritation for an alternative treatment.  2. Traction Alopecia. - Recommend minoxidil 5% foam, to be applied once daily to the affected area. - Inform about potential side effects, including initial shedding around the fourth week and possible irritation. -  Increase use of prescribed oil if irritation occurs.  3. Atopic Dermatitis. - Provide refills for triamcinolone and Eucrisa. - Continue using triamcinolone as needed, typically once a week. - Continue using Eucrisa every other day.  Side effects discussed and patient recommended to read package insert of all medications.  Electronically signed by: Greig JINNY Blower, MD 11/04/2023 9:06 AM

## 2023-10-31 ENCOUNTER — Encounter: Payer: Self-pay | Admitting: Nurse Practitioner

## 2023-10-31 MED ORDER — VITAMIN D (ERGOCALCIFEROL) 1.25 MG (50000 UNIT) PO CAPS: 50000.0000 [IU] | ORAL_CAPSULE | ORAL | 1 refills | Status: AC

## 2023-10-31 NOTE — Assessment & Plan Note (Signed)
Cholesterol levels were stable.  Will check lipid panel.

## 2023-10-31 NOTE — Assessment & Plan Note (Signed)
 Will check vitamin D level and supplement as needed.    Also encouraged to spend 15 minutes in the sun daily.

## 2023-10-31 NOTE — Assessment & Plan Note (Signed)
 Behavior modifications discussed and diet history reviewed.   Pt will continue to exercise regularly and modify diet with low GI, plant based foods and decrease intake of processed foods.  Recommend intake of daily multivitamin, Vitamin D, and calcium.  Recommend monthly self breast exams for preventive screenings, as well as recommend immunizations that include influenza, TDAP

## 2023-10-31 NOTE — Assessment & Plan Note (Signed)
 Will check metabolic panel. Also discussed importance of self care.

## 2023-10-31 NOTE — Assessment & Plan Note (Signed)
HgbA1c is stable. Will recheck today. Continue focusing on diet low in sugar and starches.

## 2023-11-05 NOTE — Telephone Encounter (Signed)
 PA Status  Pending  Medication Eucrisa 2% Ointment  Provider [employer] [place]  Method  Key/Ref# covermymeds  IAC/INTERACTIVECORP

## 2024-02-04 DIAGNOSIS — L03011 Cellulitis of right finger: Secondary | ICD-10-CM | POA: Diagnosis not present

## 2024-05-26 ENCOUNTER — Ambulatory Visit: Payer: Self-pay | Admitting: Nurse Practitioner

## 2024-05-26 ENCOUNTER — Encounter: Payer: Self-pay | Admitting: Nurse Practitioner

## 2024-05-26 VITALS — BP 120/70 | HR 81 | Temp 98.2°F | Wt 240.0 lb

## 2024-05-26 DIAGNOSIS — E559 Vitamin D deficiency, unspecified: Secondary | ICD-10-CM

## 2024-05-26 DIAGNOSIS — E6609 Other obesity due to excess calories: Secondary | ICD-10-CM

## 2024-05-26 DIAGNOSIS — R7303 Prediabetes: Secondary | ICD-10-CM | POA: Diagnosis not present

## 2024-05-26 DIAGNOSIS — Z23 Encounter for immunization: Secondary | ICD-10-CM | POA: Diagnosis not present

## 2024-05-26 DIAGNOSIS — E66812 Obesity, class 2: Secondary | ICD-10-CM

## 2024-05-26 DIAGNOSIS — Z79899 Other long term (current) drug therapy: Secondary | ICD-10-CM

## 2024-05-26 DIAGNOSIS — Z6839 Body mass index (BMI) 39.0-39.9, adult: Secondary | ICD-10-CM

## 2024-05-26 DIAGNOSIS — E7800 Pure hypercholesterolemia, unspecified: Secondary | ICD-10-CM

## 2024-05-26 NOTE — Assessment & Plan Note (Addendum)
Cholesterol levels were stable.  Will check lipid panel.

## 2024-05-26 NOTE — Assessment & Plan Note (Addendum)
 A1c is 5.8, indicating prediabetes. Discussed potential use of metformin to manage prediabetes and aid in weight loss. Emphasized dietary changes to prevent progression to diabetes. - Ordered repeat A1c test. - Will consider metformin based on A1c results.

## 2024-05-26 NOTE — Patient Instructions (Signed)
                         Contains text generated by Abridge.                                 Contains text generated by Abridge.

## 2024-05-26 NOTE — Assessment & Plan Note (Addendum)
 Influenza vaccine administered Encouraged to take Tylenol as needed for fever or muscle aches.

## 2024-05-26 NOTE — Assessment & Plan Note (Addendum)
 Inconsistent with vitamin D  supplementation. Acknowledged need for reminder system to improve adherence. - Encouraged setting reminders for vitamin D  supplementation.

## 2024-05-26 NOTE — Assessment & Plan Note (Addendum)
 Weight gain of 12 pounds since April. Stress and cortisol fluctuations may contribute. Discussed medication options for weight management, noting phentermine requires monitoring and is not covered by insurance. Potential side effects include dry mouth, increased heart rate, and heart pain. Emphasized dietary changes and lifestyle modifications to prevent weight regain post-medication. - Encouraged dietary changes focusing on fruits, vegetables, and reducing carbohydrates. - Discussed potential use of phentermine, Qsymia, or Contrave for weight management. - Advised on safe intercourse practices if considering medication due to potential fetal effects. - Suggested follow-up with [facility] for dietary guidance. - Scheduled follow-up in two months to assess progress and consider medication initiation.

## 2024-05-26 NOTE — Progress Notes (Signed)
 Gina Frost, CMA,acting as a neurosurgeon for [employer], FNP.,have documented all relevant documentation on the behalf of Gina Ada, FNP,as directed by  Gina Ada, FNP while in the presence of Gina Ada, FNP.  Subjective:  Patient ID: Gina Frost , female    DOB: [birth year] , 33 y.o.   MRN: [id number]  Chief Complaint  Patient presents with   Prediabetes    Patient presents today for a prediabetes check. Patient doesn't have any questions or concerns at this time.     Patient presents today for a 6 month f/u on her vitamin D .  She is followed by Dr. Barbra for her PAP     Discussed the use of AI scribe software for clinical note transcription with the patient, who gave verbal consent to proceed.  History of Present Illness Gina Frost is a 33 year old female who presents for a follow-up visit regarding weight management and prediabetes.  She has gained twelve pounds since April. She has difficulty maintaining a consistent exercise routine, although she attempts to incorporate walking into her daily activities. Her eating habits are irregular, often eating only twice a day without snacking.  Her recent A1c was 5.8. She has a history of postpartum depression and anxiety, which she feels are now under control. She is considering returning to school and possibly having another child in the next two to three years, but wants to achieve a healthier weight first.  Her current medication regimen includes vitamin D , which she takes inconsistently. She is not on any birth control and is mindful of her water  intake, which she maintains well.  In terms of social history, she is actively involved in her community and is considering further education. She has previously attended a [facility] program but has not been consistent with follow-up. She is due for a gynecological exam, with her last visit in August 2022.  Past Medical History:  Diagnosis Date    Allergy  07/16/2018   Anxiety    Depression    Eczema    Eczema    Multiple food allergies    Postpartum depression 04/08/2023   Vitamin D  deficiency      Family History  Problem Relation Age of Onset   Stroke Mother    Hypertension Mother    Kidney cancer Mother    Hyperlipidemia Father    Alcoholism Father    Lupus Sister    Eczema Sister    Diabetes Maternal Grandmother    Depression Sister    Depression Sister    Allergic rhinitis Neg Hx    Angioedema Neg Hx    Asthma Neg Hx    Atopy Neg Hx    Immunodeficiency Neg Hx    Urticaria Neg Hx      Current Outpatient Medications:    EPINEPHrine  0.3 mg/0.3 mL IJ SOAJ injection, Inject into the muscle., Disp: , Rfl:    EUCRISA 2 % OINT, , Disp: , Rfl:    Vitamin D , Ergocalciferol , (DRISDOL ) 1.25 MG (50000 UNIT) CAPS capsule, Take 1 capsule (50,000 Units total) by mouth every 7 (seven) days., Disp: 12 capsule, Rfl: 1   triamcinolone (KENALOG) 0.025 % ointment, Apply 1 Application topically 2 (two) times daily., Disp: , Rfl:    Allergies  Allergen Reactions   Shellfish Allergy  Anaphylaxis     Review of Systems  Constitutional: Negative.   Eyes: Negative.   Respiratory: Negative.    Cardiovascular: Negative.   Musculoskeletal: Negative.   Skin:  Negative.   Neurological: Negative.   Psychiatric/Behavioral: Negative.       Today's Vitals   05/26/24 0826  BP: 120/70  Pulse: 81  Temp: 98.2 F (36.8 C)  TempSrc: Oral  Weight: 240 lb (108.9 kg)  PainSc: 0-No pain   Body mass index is 39.94 kg/m.  Wt Readings from Last 3 Encounters:  05/26/24 240 lb (108.9 kg)  10/23/23 228 lb (103.4 kg)  04/08/23 221 lb 3.2 oz (100.3 kg)     Objective:  Physical Exam Vitals and nursing note reviewed.  Constitutional:      General: She is not in acute distress.    Appearance: Normal appearance.  Cardiovascular:     Rate and Rhythm: Normal rate and regular rhythm.     Pulses: Normal pulses.     Heart sounds: Normal heart  sounds. No murmur heard. Pulmonary:     Effort: Pulmonary effort is normal. No respiratory distress.     Breath sounds: Normal breath sounds. No wheezing.  Skin:    General: Skin is warm and dry.     Capillary Refill: Capillary refill takes less than 2 seconds.  Neurological:     General: No focal deficit present.     Mental Status: She is alert and oriented to person, place, and time.     Cranial Nerves: No cranial nerve deficit.     Motor: No weakness.  Psychiatric:        Mood and Affect: Mood normal.        Behavior: Behavior normal.        Thought Content: Thought content normal.        Judgment: Judgment normal.     Assessment And Plan:   Assessment & Plan Prediabetes A1c is 5.8, indicating prediabetes. Discussed potential use of metformin to manage prediabetes and aid in weight loss. Emphasized dietary changes to prevent progression to diabetes. - Ordered repeat A1c test. - Will consider metformin based on A1c results. Need for influenza vaccination Influenza vaccine administered Encouraged to take Tylenol  as needed for fever or muscle aches.  Vitamin D  deficiency Inconsistent with vitamin D  supplementation. Acknowledged need for reminder system to improve adherence. - Encouraged setting reminders for vitamin D  supplementation. Elevated cholesterol Cholesterol levels were stable.  Will check lipid panel. Class 2 obesity due to excess calories with body mass index (BMI) of 39.0 to 39.9 in adult, unspecified whether serious comorbidity present  Other long term (current) drug therapy  Class 2 severe obesity due to excess calories with serious comorbidity and body mass index (BMI) of 35.0 to 35.9 in adult Weight gain of 12 pounds since April. Stress and cortisol fluctuations may contribute. Discussed medication options for weight management, noting phentermine requires monitoring and is not covered by insurance. Potential side effects include dry mouth, increased heart  rate, and heart pain. Emphasized dietary changes and lifestyle modifications to prevent weight regain post-medication. - Encouraged dietary changes focusing on fruits, vegetables, and reducing carbohydrates. - Discussed potential use of phentermine, Qsymia, or Contrave for weight management. - Advised on safe intercourse practices if considering medication due to potential fetal effects. - Suggested follow-up with [facility] for dietary guidance. - Scheduled follow-up in two months to assess progress and consider medication initiation.  Orders Placed This Encounter  Procedures   Flu vaccine trivalent PF, 6mos and older(Flulaval,Afluria,Fluarix,Fluzone)   CBC   CMP14+EGFR   Hemoglobin A1c   Lipid panel   VITAMIN D  25 Hydroxy (Vit-D Deficiency, Fractures)  Return if symptoms worsen or fail to improve.  Patient was given opportunity to ask questions. Patient verbalized understanding of the plan and was able to repeat key elements of the plan. All questions were answered to their satisfaction.    LILLETTE Gina Ada, FNP, have reviewed all documentation for this visit. The documentation on 05/26/24 for the exam, diagnosis, procedures, and orders are all accurate and complete.   IF YOU HAVE BEEN REFERRED TO A SPECIALIST, IT MAY TAKE 1-2 WEEKS TO SCHEDULE/PROCESS THE REFERRAL. IF YOU HAVE NOT HEARD FROM US /SPECIALIST IN TWO WEEKS, PLEASE GIVE US  A CALL AT [phone].

## 2024-05-27 LAB — LIPID PANEL
Chol/HDL Ratio: 2.9 ratio (ref 0.0–4.4)
Cholesterol, Total: 196 mg/dL (ref 100–199)
HDL: 68 mg/dL (ref >39)
LDL Chol Calc (NIH): 102 mg/dL — ABNORMAL HIGH (ref 0–99)
Triglycerides: 154 mg/dL — ABNORMAL HIGH (ref 0–149)
VLDL Cholesterol Cal: 26 mg/dL (ref 5–40)

## 2024-05-27 LAB — HEMOGLOBIN A1C
Est. average glucose Bld gHb Est-mCnc: 120 mg/dL
Hgb A1c MFr Bld: 5.8 % — ABNORMAL HIGH (ref 4.8–5.6)

## 2024-05-27 LAB — CBC
Hematocrit: 38.9 % (ref 34.0–46.6)
Hemoglobin: 12.5 g/dL (ref 11.1–15.9)
MCH: 27.9 pg (ref 26.6–33.0)
MCHC: 32.1 g/dL (ref 31.5–35.7)
MCV: 87 fL (ref 79–97)
Platelets: 238 x10E3/uL (ref 150–450)
RBC: 4.48 x10E6/uL (ref 3.77–5.28)
RDW: 13.5 % (ref 11.7–15.4)
WBC: 7.4 x10E3/uL (ref 3.4–10.8)

## 2024-05-27 LAB — VITAMIN D 25 HYDROXY (VIT D DEFICIENCY, FRACTURES): Vit D, 25-Hydroxy: 24.8 ng/mL — ABNORMAL LOW (ref 30.0–100.0)

## 2024-05-27 LAB — CMP14+EGFR
ALT: 14 IU/L (ref 0–32)
AST: 15 IU/L (ref 0–40)
Albumin: 4.3 g/dL (ref 3.9–4.9)
Alkaline Phosphatase: 87 IU/L (ref 41–116)
BUN/Creatinine Ratio: 14 (ref 9–23)
BUN: 10 mg/dL (ref 6–20)
Bilirubin Total: 0.3 mg/dL (ref 0.0–1.2)
CO2: 24 mmol/L (ref 20–29)
Calcium: 9.4 mg/dL (ref 8.7–10.2)
Chloride: 101 mmol/L (ref 96–106)
Creatinine, Ser: 0.7 mg/dL (ref 0.57–1.00)
Globulin, Total: 2.6 g/dL (ref 1.5–4.5)
Glucose: 92 mg/dL (ref 70–99)
Potassium: 4.3 mmol/L (ref 3.5–5.2)
Sodium: 138 mmol/L (ref 134–144)
Total Protein: 6.9 g/dL (ref 6.0–8.5)
eGFR: 119 mL/min/1.73 (ref >59)

## 2024-07-22 ENCOUNTER — Ambulatory Visit: Admitting: Family Medicine

## 2024-07-22 ENCOUNTER — Other Ambulatory Visit (HOSPITAL_COMMUNITY)
Admission: RE | Admit: 2024-07-22 | Discharge: 2024-07-22 | Disposition: A | Discharge status: Home / Self Care | Source: Ambulatory Visit | Attending: Family Medicine | Admitting: Family Medicine

## 2024-07-22 VITALS — BP 116/62 | HR 75 | Ht 65.0 in | Wt 234.1 lb

## 2024-07-22 DIAGNOSIS — Z202 Contact with and (suspected) exposure to infections with a predominantly sexual mode of transmission: Secondary | ICD-10-CM | POA: Diagnosis present

## 2024-07-22 DIAGNOSIS — Z0000 Encounter for general adult medical examination without abnormal findings: Secondary | ICD-10-CM | POA: Diagnosis present

## 2024-07-22 NOTE — Progress Notes (Signed)
 Patient presents for Annual.  LMP: No LMP recorded.  Last pap: 03/08/2021 Contraception: None Mammogram: Not yet indicated STD Screening: Accepts Flu Vaccine : Declines  CC:   Fun Fact: wants to talk about having a baby! Yay!!!

## 2024-07-22 NOTE — Progress Notes (Signed)
 "  ANNUAL EXAM Patient name: Gina Frost MRN [id number]  Date of birth: 12-29-1990 Chief Complaint:   Annual Exam  History of Present Illness:   Gina Frost is a 34 y.o.  [id number]  female  being seen today for a routine annual exam.  Current complaints: They are contemplating having another baby.  Still thinking about options send whether this is what she would like to do.  Not using hormonal birth control.  Periods normal  Patient's last menstrual period was 07/11/2024 (exact date).    Last pap 2022. Results were: NILM w/ HRHPV negative. H/O abnormal pap: no Last mammogram:      07/22/2024   10:44 AM 05/26/2024    8:29 AM 10/23/2023   10:02 AM 04/08/2023    9:10 AM 10/03/2022    8:21 AM  Depression screen PHQ 2/9  Decreased Interest 0 0 0 1 1  Down, Depressed, Hopeless 0 0 0 2 2  PHQ - 2 Score 0 0 0 3 3  Altered sleeping 1 0 2 3 3   Tired, decreased energy 1 0 1 3 3   Change in appetite 2 0 1 2 0  Feeling bad or failure about yourself  0 0 0 2 3  Trouble concentrating 0 0 0 3 0  Moving slowly or fidgety/restless 0 0 0 0 0  Suicidal thoughts 0 0 0 0 1  PHQ-9 Score 4 0 4  16  13    Difficult doing work/chores  Not difficult at all Not difficult at all Somewhat difficult Somewhat difficult     Data saved with a previous flowsheet row definition        07/22/2024   10:45 AM 05/26/2024    8:29 AM 10/23/2023   10:03 AM 04/08/2023    9:10 AM  GAD 7 : Generalized Anxiety Score  Nervous, Anxious, on Edge 1 0 1 2  Control/stop worrying 0 0 0 2  Worry too much - different things 0 0 0 2  Trouble relaxing 0 0 1 1  Restless 0 0 1 0  Easily annoyed or irritable 0 0 1 2  Afraid - awful might happen 0 0 0 0  Total GAD 7 Score 1 0 4 9  Anxiety Difficulty  Not difficult at all Not difficult at all Somewhat difficult     Review of Systems:   Pertinent items are noted in HPI Denies any headaches, blurred vision, fatigue, shortness of breath, chest pain, abdominal pain, abnormal  vaginal discharge/itching/odor/irritation, problems with periods, bowel movements, urination, or intercourse unless otherwise stated above. Pertinent History Reviewed:  Reviewed past medical,surgical, social and family history.  Reviewed problem list, medications and allergies. Physical Assessment:   Vitals:   07/22/24 1029  BP: 116/62  Pulse: 75  Weight: 234 lb 1.9 oz (106.2 kg)  Height: 5' 5 (1.651 m)  Body mass index is 38.96 kg/m.        Physical Examination:   General appearance - well appearing, and in no distress  Mental status - alert, oriented to person, place, and time  Psych:  She has a normal mood and affect  Skin - warm and dry, normal color, no suspicious lesions noted  Chest - effort normal, all lung fields clear to auscultation bilaterally  Heart - normal rate and regular rhythm  Neck:  midline trachea, no thyromegaly or nodules  Breasts - breasts appear normal, no suspicious masses, no skin or nipple changes or axillary nodes  Abdomen - soft, nontender, nondistended,  no masses or organomegaly  Pelvic - VULVA: normal appearing vulva with no masses, tenderness or lesions  VAGINA: normal appearing vagina with normal color and discharge, no lesions  CERVIX: normal appearing cervix without discharge or lesions, no CMT  Thin prep pap is done with HR HPV cotesting  UTERUS: uterus is felt to be normal size, shape, consistency and nontender   ADNEXA: No adnexal masses or tenderness noted.  Extremities:  No swelling or varicosities noted  Chaperone present for exam  Assessment & Plan:  1. Encounter for well woman exam without gynecological exam (Primary) - Cytology - PAP - RPR+HBsAg+HCVAb+... - Cervicovaginal ancillary only( [place])  2. Possible exposure to sexually transmitted infection - RPR+HBsAg+HCVAb+... - Cervicovaginal ancillary only( [place])   Labs/procedures today:   Orders Placed This Encounter  Procedures   RPR+HBsAg+HCVAb+...    Meds:  No orders of the defined types were placed in this encounter.   Follow-up: No follow-ups on file.  Gina Inabinet J Lyrick Worland, DO 07/22/2024 12:54 PM  "

## 2024-07-23 ENCOUNTER — Ambulatory Visit: Payer: Self-pay | Admitting: Family Medicine

## 2024-07-23 LAB — CERVICOVAGINAL ANCILLARY ONLY
Bacterial Vaginitis (gardnerella): NEGATIVE
Candida Glabrata: NEGATIVE
Candida Vaginitis: NEGATIVE
Chlamydia: NEGATIVE
Comment: NEGATIVE
Comment: NEGATIVE
Comment: NEGATIVE
Comment: NEGATIVE
Comment: NEGATIVE
Comment: NORMAL
Neisseria Gonorrhea: NEGATIVE
Trichomonas: NEGATIVE

## 2024-07-23 LAB — RPR+HBSAG+HCVAB+...
HIV Screen 4th Generation wRfx: NONREACTIVE
Hep C Virus Ab: NONREACTIVE
Hepatitis B Surface Ag: NEGATIVE
RPR Ser Ql: NONREACTIVE

## 2024-07-27 LAB — CYTOLOGY - PAP
Comment: NEGATIVE
Diagnosis: NEGATIVE
High risk HPV: NEGATIVE

## 2024-10-27 ENCOUNTER — Encounter: Payer: Self-pay | Admitting: Nurse Practitioner
